# Patient Record
Sex: Female | Born: 1950 | Race: White | Hispanic: No | Marital: Married | Smoking: Never smoker
Health system: Southern US, Community
[De-identification: ages and names within clinical notes are randomized; demographics above are authoritative.]

## PROBLEM LIST (undated history)

## (undated) DIAGNOSIS — I10 Essential (primary) hypertension: Secondary | ICD-10-CM

---

## 2018-10-15 ENCOUNTER — Other Ambulatory Visit: Payer: Self-pay

## 2018-10-15 ENCOUNTER — Observation Stay: Payer: Medicare HMO | Admitting: Anesthesiology

## 2018-10-15 ENCOUNTER — Observation Stay: Payer: Medicare HMO

## 2018-10-15 ENCOUNTER — Encounter
Admission: EM | Disposition: A | Discharge status: Other Acute Inpatient Hospital | Payer: Self-pay | Source: Home / Self Care | Attending: Surgery

## 2018-10-15 ENCOUNTER — Inpatient Hospital Stay
Admission: EM | Admit: 2018-10-15 | Discharge: 2018-10-26 | DRG: 417 | Disposition: A | Discharge status: Other Acute Inpatient Hospital | Payer: Medicare HMO | Attending: Surgery | Admitting: Surgery

## 2018-10-15 ENCOUNTER — Emergency Department: Payer: Medicare HMO

## 2018-10-15 ENCOUNTER — Encounter: Payer: Self-pay | Admitting: Emergency Medicine

## 2018-10-15 DIAGNOSIS — Z1159 Encounter for screening for other viral diseases: Secondary | ICD-10-CM

## 2018-10-15 DIAGNOSIS — K219 Gastro-esophageal reflux disease without esophagitis: Secondary | ICD-10-CM | POA: Diagnosis present

## 2018-10-15 DIAGNOSIS — E876 Hypokalemia: Secondary | ICD-10-CM | POA: Diagnosis not present

## 2018-10-15 DIAGNOSIS — D649 Anemia, unspecified: Secondary | ICD-10-CM | POA: Diagnosis not present

## 2018-10-15 DIAGNOSIS — J811 Chronic pulmonary edema: Secondary | ICD-10-CM | POA: Diagnosis not present

## 2018-10-15 DIAGNOSIS — Z9889 Other specified postprocedural states: Secondary | ICD-10-CM

## 2018-10-15 DIAGNOSIS — K8020 Calculus of gallbladder without cholecystitis without obstruction: Secondary | ICD-10-CM

## 2018-10-15 DIAGNOSIS — Z79899 Other long term (current) drug therapy: Secondary | ICD-10-CM

## 2018-10-15 DIAGNOSIS — K810 Acute cholecystitis: Secondary | ICD-10-CM | POA: Diagnosis not present

## 2018-10-15 DIAGNOSIS — Z0189 Encounter for other specified special examinations: Secondary | ICD-10-CM

## 2018-10-15 DIAGNOSIS — K9171 Accidental puncture and laceration of a digestive system organ or structure during a digestive system procedure: Secondary | ICD-10-CM | POA: Diagnosis not present

## 2018-10-15 DIAGNOSIS — N179 Acute kidney failure, unspecified: Secondary | ICD-10-CM | POA: Diagnosis not present

## 2018-10-15 DIAGNOSIS — J9600 Acute respiratory failure, unspecified whether with hypoxia or hypercapnia: Secondary | ICD-10-CM

## 2018-10-15 DIAGNOSIS — K429 Umbilical hernia without obstruction or gangrene: Secondary | ICD-10-CM | POA: Diagnosis not present

## 2018-10-15 DIAGNOSIS — R0602 Shortness of breath: Secondary | ICD-10-CM

## 2018-10-15 DIAGNOSIS — R1013 Epigastric pain: Secondary | ICD-10-CM

## 2018-10-15 DIAGNOSIS — E8770 Fluid overload, unspecified: Secondary | ICD-10-CM | POA: Diagnosis not present

## 2018-10-15 DIAGNOSIS — N132 Hydronephrosis with renal and ureteral calculous obstruction: Secondary | ICD-10-CM | POA: Diagnosis not present

## 2018-10-15 DIAGNOSIS — N201 Calculus of ureter: Secondary | ICD-10-CM

## 2018-10-15 DIAGNOSIS — J9811 Atelectasis: Secondary | ICD-10-CM | POA: Diagnosis not present

## 2018-10-15 DIAGNOSIS — E872 Acidosis: Secondary | ICD-10-CM | POA: Diagnosis not present

## 2018-10-15 DIAGNOSIS — K567 Ileus, unspecified: Secondary | ICD-10-CM | POA: Diagnosis not present

## 2018-10-15 DIAGNOSIS — J9601 Acute respiratory failure with hypoxia: Secondary | ICD-10-CM | POA: Diagnosis not present

## 2018-10-15 DIAGNOSIS — K8000 Calculus of gallbladder with acute cholecystitis without obstruction: Secondary | ICD-10-CM | POA: Diagnosis not present

## 2018-10-15 DIAGNOSIS — Y658 Other specified misadventures during surgical and medical care: Secondary | ICD-10-CM | POA: Diagnosis not present

## 2018-10-15 DIAGNOSIS — J90 Pleural effusion, not elsewhere classified: Secondary | ICD-10-CM

## 2018-10-15 DIAGNOSIS — Z9049 Acquired absence of other specified parts of digestive tract: Secondary | ICD-10-CM

## 2018-10-15 DIAGNOSIS — K668 Other specified disorders of peritoneum: Secondary | ICD-10-CM

## 2018-10-15 DIAGNOSIS — J939 Pneumothorax, unspecified: Secondary | ICD-10-CM | POA: Diagnosis not present

## 2018-10-15 DIAGNOSIS — I10 Essential (primary) hypertension: Secondary | ICD-10-CM | POA: Diagnosis present

## 2018-10-15 DIAGNOSIS — R627 Adult failure to thrive: Secondary | ICD-10-CM | POA: Diagnosis present

## 2018-10-15 DIAGNOSIS — Z87442 Personal history of urinary calculi: Secondary | ICD-10-CM

## 2018-10-15 DIAGNOSIS — Z978 Presence of other specified devices: Secondary | ICD-10-CM

## 2018-10-15 DIAGNOSIS — Z452 Encounter for adjustment and management of vascular access device: Secondary | ICD-10-CM

## 2018-10-15 DIAGNOSIS — Z90710 Acquired absence of both cervix and uterus: Secondary | ICD-10-CM

## 2018-10-15 DIAGNOSIS — R6521 Severe sepsis with septic shock: Secondary | ICD-10-CM | POA: Diagnosis not present

## 2018-10-15 DIAGNOSIS — D72829 Elevated white blood cell count, unspecified: Secondary | ICD-10-CM

## 2018-10-15 DIAGNOSIS — N261 Atrophy of kidney (terminal): Secondary | ICD-10-CM | POA: Diagnosis present

## 2018-10-15 DIAGNOSIS — A419 Sepsis, unspecified organism: Secondary | ICD-10-CM | POA: Diagnosis not present

## 2018-10-15 DIAGNOSIS — K660 Peritoneal adhesions (postprocedural) (postinfection): Secondary | ICD-10-CM | POA: Diagnosis present

## 2018-10-15 DIAGNOSIS — R109 Unspecified abdominal pain: Secondary | ICD-10-CM | POA: Diagnosis not present

## 2018-10-15 HISTORY — PX: CYSTOSCOPY WITH STENT PLACEMENT: SHX5790

## 2018-10-15 HISTORY — DX: Essential (primary) hypertension: I10

## 2018-10-15 HISTORY — PX: CHOLECYSTECTOMY: SHX55

## 2018-10-15 LAB — COMPREHENSIVE METABOLIC PANEL
ALT: 12 U/L (ref 0–44)
AST: 18 U/L (ref 15–41)
Albumin: 4 g/dL (ref 3.5–5.0)
Alkaline Phosphatase: 78 U/L (ref 38–126)
Anion gap: 11 (ref 5–15)
BUN: 31 mg/dL — ABNORMAL HIGH (ref 8–23)
CO2: 22 mmol/L (ref 22–32)
Calcium: 9.7 mg/dL (ref 8.9–10.3)
Chloride: 107 mmol/L (ref 98–111)
Creatinine, Ser: 1.59 mg/dL — ABNORMAL HIGH (ref 0.44–1.00)
GFR calc Af Amer: 39 mL/min — ABNORMAL LOW (ref >60)
GFR calc non Af Amer: 33 mL/min — ABNORMAL LOW (ref >60)
Glucose, Bld: 166 mg/dL — ABNORMAL HIGH (ref 70–99)
Potassium: 3.6 mmol/L (ref 3.5–5.1)
Sodium: 140 mmol/L (ref 135–145)
Total Bilirubin: 0.5 mg/dL (ref 0.3–1.2)
Total Protein: 7.5 g/dL (ref 6.5–8.1)

## 2018-10-15 LAB — URINALYSIS, COMPLETE (UACMP) WITH MICROSCOPIC
Bilirubin Urine: NEGATIVE
Glucose, UA: NEGATIVE mg/dL
Hgb urine dipstick: NEGATIVE
Ketones, ur: NEGATIVE mg/dL
Leukocytes,Ua: NEGATIVE
Nitrite: NEGATIVE
Protein, ur: NEGATIVE mg/dL
Specific Gravity, Urine: 1.014 (ref 1.005–1.030)
pH: 5 (ref 5.0–8.0)

## 2018-10-15 LAB — CBC
HCT: 39 % (ref 36.0–46.0)
Hemoglobin: 12.6 g/dL (ref 12.0–15.0)
MCH: 29 pg (ref 26.0–34.0)
MCHC: 32.3 g/dL (ref 30.0–36.0)
MCV: 89.7 fL (ref 80.0–100.0)
Platelets: 419 10*3/uL — ABNORMAL HIGH (ref 150–400)
RBC: 4.35 MIL/uL (ref 3.87–5.11)
RDW: 12.9 % (ref 11.5–15.5)
WBC: 18 10*3/uL — ABNORMAL HIGH (ref 4.0–10.5)
nRBC: 0 % (ref 0.0–0.2)

## 2018-10-15 LAB — SURGICAL PCR SCREEN
MRSA, PCR: NEGATIVE
Staphylococcus aureus: NEGATIVE

## 2018-10-15 LAB — TROPONIN I (HIGH SENSITIVITY): Troponin I (High Sensitivity): <2 ng/L (ref <18)

## 2018-10-15 LAB — LIPASE, BLOOD: Lipase: 39 U/L (ref 11–51)

## 2018-10-15 LAB — SARS CORONAVIRUS 2 BY RT PCR (HOSPITAL ORDER, PERFORMED IN ~~LOC~~ HOSPITAL LAB): SARS Coronavirus 2: NEGATIVE

## 2018-10-15 SURGERY — LAPAROSCOPIC CHOLECYSTECTOMY
Anesthesia: General | Laterality: Right

## 2018-10-15 MED ORDER — HYDROMORPHONE HCL 1 MG/ML IJ SOLN
1.0000 mg | Freq: Once | INTRAMUSCULAR | Status: AC
  Administered 2018-10-15: 05:00:00 1 mg via INTRAVENOUS

## 2018-10-15 MED ORDER — HYDROCORTISONE NA SUCCINATE PF 100 MG IJ SOLR
INTRAMUSCULAR | Status: AC
  Filled 2018-10-15: qty 2

## 2018-10-15 MED ORDER — ONDANSETRON HCL 4 MG/2ML IJ SOLN
4.0000 mg | Freq: Once | INTRAMUSCULAR | Status: AC
  Administered 2018-10-15: 4 mg via INTRAVENOUS
  Filled 2018-10-15: qty 2

## 2018-10-15 MED ORDER — ACETAMINOPHEN 10 MG/ML IV SOLN
INTRAVENOUS | Status: AC
  Filled 2018-10-15: qty 100

## 2018-10-15 MED ORDER — SODIUM CHLORIDE 0.9 % IV SOLN
INTRAVENOUS | Status: DC
  Administered 2018-10-15 – 2018-10-18 (×13): via INTRAVENOUS

## 2018-10-15 MED ORDER — TAMSULOSIN HCL 0.4 MG PO CAPS: 0.4000 mg | ORAL_CAPSULE | Freq: Every day | ORAL | 3 refills | Status: DC

## 2018-10-15 MED ORDER — ONDANSETRON HCL 4 MG/2ML IJ SOLN
4.0000 mg | Freq: Four times a day (QID) | INTRAMUSCULAR | Status: DC | PRN
  Administered 2018-10-15 – 2018-10-25 (×8): 4 mg via INTRAVENOUS
  Filled 2018-10-15 (×7): qty 2

## 2018-10-15 MED ORDER — ACETAMINOPHEN 10 MG/ML IV SOLN
INTRAVENOUS | Status: DC | PRN
  Administered 2018-10-15: 1000 mg via INTRAVENOUS

## 2018-10-15 MED ORDER — SUCCINYLCHOLINE CHLORIDE 20 MG/ML IJ SOLN
INTRAMUSCULAR | Status: DC | PRN
  Administered 2018-10-15: 100 mg via INTRAVENOUS

## 2018-10-15 MED ORDER — MIDAZOLAM HCL 2 MG/2ML IJ SOLN
INTRAMUSCULAR | Status: AC
  Filled 2018-10-15: qty 2

## 2018-10-15 MED ORDER — LABETALOL HCL 5 MG/ML IV SOLN
INTRAVENOUS | Status: AC
  Filled 2018-10-15: qty 4

## 2018-10-15 MED ORDER — DEXAMETHASONE SODIUM PHOSPHATE 10 MG/ML IJ SOLN
INTRAMUSCULAR | Status: DC | PRN
  Administered 2018-10-15: 4 mg via INTRAVENOUS

## 2018-10-15 MED ORDER — LACTATED RINGERS IV SOLN
INTRAVENOUS | Status: DC | PRN
  Administered 2018-10-15: 13:00:00 via INTRAVENOUS

## 2018-10-15 MED ORDER — BUPIVACAINE-EPINEPHRINE (PF) 0.25% -1:200000 IJ SOLN
INTRAMUSCULAR | Status: DC | PRN
  Administered 2018-10-15: 30 mL

## 2018-10-15 MED ORDER — ENOXAPARIN SODIUM 40 MG/0.4ML ~~LOC~~ SOLN
40.0000 mg | SUBCUTANEOUS | Status: DC
  Administered 2018-10-16 – 2018-10-18 (×3): 40 mg via SUBCUTANEOUS
  Filled 2018-10-15 (×3): qty 0.4

## 2018-10-15 MED ORDER — POLYETHYLENE GLYCOL 3350 17 G PO PACK
17.0000 g | PACK | Freq: Every day | ORAL | Status: DC | PRN
  Administered 2018-10-16 – 2018-10-18 (×2): 17 g via ORAL
  Filled 2018-10-15 (×2): qty 1

## 2018-10-15 MED ORDER — BUPIVACAINE-EPINEPHRINE (PF) 0.25% -1:200000 IJ SOLN
INTRAMUSCULAR | Status: AC
  Filled 2018-10-15: qty 30

## 2018-10-15 MED ORDER — MORPHINE SULFATE (PF) 4 MG/ML IV SOLN
4.0000 mg | Freq: Once | INTRAVENOUS | Status: AC
  Administered 2018-10-15: 4 mg via INTRAVENOUS
  Filled 2018-10-15: qty 1

## 2018-10-15 MED ORDER — VASOPRESSIN 20 UNIT/ML IV SOLN
INTRAVENOUS | Status: AC
  Filled 2018-10-15: qty 1

## 2018-10-15 MED ORDER — ONDANSETRON 4 MG PO TBDP
4.0000 mg | ORAL_TABLET | Freq: Four times a day (QID) | ORAL | Status: DC | PRN
  Filled 2018-10-15 (×2): qty 1

## 2018-10-15 MED ORDER — FENTANYL CITRATE (PF) 100 MCG/2ML IJ SOLN
INTRAMUSCULAR | Status: DC | PRN
  Administered 2018-10-15 (×2): 25 ug via INTRAVENOUS
  Administered 2018-10-15: 50 ug via INTRAVENOUS

## 2018-10-15 MED ORDER — EPHEDRINE SULFATE 50 MG/ML IJ SOLN
INTRAMUSCULAR | Status: DC | PRN
  Administered 2018-10-15: 10 mg via INTRAVENOUS

## 2018-10-15 MED ORDER — ONDANSETRON HCL 4 MG/2ML IJ SOLN
INTRAMUSCULAR | Status: AC
  Filled 2018-10-15: qty 2

## 2018-10-15 MED ORDER — MIDAZOLAM HCL 2 MG/2ML IJ SOLN
INTRAMUSCULAR | Status: DC | PRN
  Administered 2018-10-15: 2 mg via INTRAVENOUS

## 2018-10-15 MED ORDER — FENTANYL CITRATE (PF) 100 MCG/2ML IJ SOLN
INTRAMUSCULAR | Status: AC
  Filled 2018-10-15: qty 2

## 2018-10-15 MED ORDER — LIDOCAINE HCL (PF) 2 % IJ SOLN
INTRAMUSCULAR | Status: AC
  Filled 2018-10-15: qty 10

## 2018-10-15 MED ORDER — ROCURONIUM BROMIDE 100 MG/10ML IV SOLN
INTRAVENOUS | Status: AC
  Filled 2018-10-15: qty 1

## 2018-10-15 MED ORDER — SUGAMMADEX SODIUM 200 MG/2ML IV SOLN
INTRAVENOUS | Status: AC
  Filled 2018-10-15: qty 2

## 2018-10-15 MED ORDER — VASOPRESSIN 20 UNIT/ML IV SOLN
INTRAVENOUS | Status: DC | PRN
  Administered 2018-10-15: .5 [IU] via INTRAVENOUS
  Administered 2018-10-15: 2 [IU] via INTRAVENOUS
  Administered 2018-10-15: .5 [IU] via INTRAVENOUS
  Administered 2018-10-15: 1 [IU] via INTRAVENOUS
  Administered 2018-10-15: 3 [IU] via INTRAVENOUS
  Administered 2018-10-15: 1 [IU] via INTRAVENOUS

## 2018-10-15 MED ORDER — PHENYLEPHRINE HCL (PRESSORS) 10 MG/ML IV SOLN
INTRAVENOUS | Status: DC | PRN
  Administered 2018-10-15: 100 ug via INTRAVENOUS
  Administered 2018-10-15: 300 ug via INTRAVENOUS
  Administered 2018-10-15 (×4): 100 ug via INTRAVENOUS

## 2018-10-15 MED ORDER — DEXAMETHASONE SODIUM PHOSPHATE 10 MG/ML IJ SOLN
INTRAMUSCULAR | Status: AC
  Filled 2018-10-15: qty 1

## 2018-10-15 MED ORDER — FENTANYL CITRATE (PF) 100 MCG/2ML IJ SOLN: 25.0000 ug | INTRAMUSCULAR | Status: DC | PRN

## 2018-10-15 MED ORDER — LACTATED RINGERS IV SOLN
INTRAVENOUS | Status: DC | PRN
  Administered 2018-10-15 (×2): via INTRAVENOUS

## 2018-10-15 MED ORDER — SODIUM CHLORIDE 0.9 % IV BOLUS
1000.0000 mL | Freq: Once | INTRAVENOUS | Status: AC
  Administered 2018-10-15: 1000 mL via INTRAVENOUS

## 2018-10-15 MED ORDER — SUGAMMADEX SODIUM 200 MG/2ML IV SOLN
INTRAVENOUS | Status: DC | PRN
  Administered 2018-10-15: 200 mg via INTRAVENOUS

## 2018-10-15 MED ORDER — LIDOCAINE HCL (CARDIAC) PF 100 MG/5ML IV SOSY
PREFILLED_SYRINGE | INTRAVENOUS | Status: DC | PRN
  Administered 2018-10-15: 100 mg via INTRAVENOUS

## 2018-10-15 MED ORDER — ONDANSETRON HCL 4 MG/2ML IJ SOLN: 4.0000 mg | Freq: Once | INTRAMUSCULAR | Status: DC | PRN

## 2018-10-15 MED ORDER — ROCURONIUM BROMIDE 100 MG/10ML IV SOLN
INTRAVENOUS | Status: DC | PRN
  Administered 2018-10-15: 10 mg via INTRAVENOUS
  Administered 2018-10-15: 40 mg via INTRAVENOUS

## 2018-10-15 MED ORDER — HYDROCORTISONE NA SUCCINATE PF 100 MG IJ SOLR
INTRAMUSCULAR | Status: DC | PRN
  Administered 2018-10-15: 100 mg via INTRAVENOUS

## 2018-10-15 MED ORDER — ACETAMINOPHEN 500 MG PO TABS
1000.0000 mg | ORAL_TABLET | Freq: Four times a day (QID) | ORAL | Status: DC | PRN
  Administered 2018-10-16 – 2018-10-18 (×2): 1000 mg via ORAL
  Filled 2018-10-15 (×2): qty 2

## 2018-10-15 MED ORDER — MUPIROCIN 2 % EX OINT
1.0000 "application " | TOPICAL_OINTMENT | Freq: Two times a day (BID) | CUTANEOUS | Status: DC
  Administered 2018-10-15: 1 via NASAL
  Filled 2018-10-15: qty 22

## 2018-10-15 MED ORDER — OXYCODONE HCL 5 MG PO TABS
5.0000 mg | ORAL_TABLET | ORAL | Status: DC | PRN
  Administered 2018-10-15 – 2018-10-18 (×9): 5 mg via ORAL
  Filled 2018-10-15 (×10): qty 1

## 2018-10-15 MED ORDER — HYDROMORPHONE HCL 1 MG/ML IJ SOLN
INTRAMUSCULAR | Status: AC
  Administered 2018-10-15: 1 mg via INTRAVENOUS
  Filled 2018-10-15: qty 1

## 2018-10-15 MED ORDER — KETOROLAC TROMETHAMINE 15 MG/ML IJ SOLN
15.0000 mg | Freq: Four times a day (QID) | INTRAMUSCULAR | Status: DC | PRN
  Administered 2018-10-15 – 2018-10-18 (×4): 15 mg via INTRAVENOUS
  Filled 2018-10-15 (×4): qty 1

## 2018-10-15 MED ORDER — PROPOFOL 10 MG/ML IV BOLUS
INTRAVENOUS | Status: DC | PRN
  Administered 2018-10-15: 150 mg via INTRAVENOUS

## 2018-10-15 MED ORDER — SUCCINYLCHOLINE CHLORIDE 20 MG/ML IJ SOLN
INTRAMUSCULAR | Status: AC
  Filled 2018-10-15: qty 1

## 2018-10-15 MED ORDER — PANTOPRAZOLE SODIUM 40 MG IV SOLR
40.0000 mg | Freq: Every day | INTRAVENOUS | Status: DC
  Administered 2018-10-15 – 2018-10-25 (×10): 40 mg via INTRAVENOUS
  Filled 2018-10-15 (×10): qty 40

## 2018-10-15 MED ORDER — PIPERACILLIN-TAZOBACTAM 3.375 G IVPB
3.3750 g | Freq: Three times a day (TID) | INTRAVENOUS | Status: DC
  Administered 2018-10-15 – 2018-10-26 (×33): 3.375 g via INTRAVENOUS
  Filled 2018-10-15 (×36): qty 50

## 2018-10-15 MED ORDER — PHENAZOPYRIDINE HCL 200 MG PO TABS: 200.0000 mg | ORAL_TABLET | Freq: Three times a day (TID) | ORAL | 0 refills | Status: DC | PRN

## 2018-10-15 MED ORDER — HYDROMORPHONE HCL 1 MG/ML IJ SOLN
0.5000 mg | INTRAMUSCULAR | Status: DC | PRN
  Administered 2018-10-15 – 2018-10-18 (×11): 0.5 mg via INTRAVENOUS
  Filled 2018-10-15 (×12): qty 0.5

## 2018-10-15 SURGICAL SUPPLY — 61 items
APPLIER CLIP 5 13 M/L LIGAMAX5 (MISCELLANEOUS) ×3
BAG DRAIN CYSTO-URO LG1000N (MISCELLANEOUS) ×3 IMPLANT
BLADE SURG 15 STRL LF DISP TIS (BLADE) ×2 IMPLANT
BLADE SURG 15 STRL SS (BLADE) ×1
CANISTER SUCT 1200ML W/VALVE (MISCELLANEOUS) ×3 IMPLANT
CATH CHOLANGI 4FR 420404F (CATHETERS) IMPLANT
CATH URETL 5X70 OPEN END (CATHETERS) ×3 IMPLANT
CATH URETL OPEN END 6FR 70 (CATHETERS) ×3 IMPLANT
CHLORAPREP W/TINT 26 (MISCELLANEOUS) ×3 IMPLANT
CLIP APPLIE 5 13 M/L LIGAMAX5 (MISCELLANEOUS) ×2 IMPLANT
CONRAY 60ML FOR OR (MISCELLANEOUS) IMPLANT
COVER WAND RF STERILE (DRAPES) ×3 IMPLANT
DERMABOND ADVANCED (GAUZE/BANDAGES/DRESSINGS) ×1
DERMABOND ADVANCED .7 DNX12 (GAUZE/BANDAGES/DRESSINGS) ×2 IMPLANT
DRAPE C-ARM XRAY 36X54 (DRAPES) IMPLANT
ELECT REM PT RETURN 9FT ADLT (ELECTROSURGICAL) ×3
ELECTRODE REM PT RTRN 9FT ADLT (ELECTROSURGICAL) ×2 IMPLANT
GLOVE BIO SURGEON STRL SZ7.5 (GLOVE) ×21 IMPLANT
GLOVE SURG SYN 7.0 (GLOVE) ×3 IMPLANT
GLOVE SURG SYN 7.5  E (GLOVE) ×1
GLOVE SURG SYN 7.5 E (GLOVE) ×2 IMPLANT
GOWN STRL REUS W/ TWL LRG LVL3 (GOWN DISPOSABLE) ×8 IMPLANT
GOWN STRL REUS W/TWL LRG LVL3 (GOWN DISPOSABLE) ×4
GUIDEWIRE STR DUAL SENSOR (WIRE) ×3 IMPLANT
IRRIGATION STRYKERFLOW (MISCELLANEOUS) ×2 IMPLANT
IRRIGATOR STRYKERFLOW (MISCELLANEOUS) ×3
IV CATH ANGIO 12GX3 LT BLUE (NEEDLE) IMPLANT
IV NS 1000ML (IV SOLUTION) ×1
IV NS 1000ML BAXH (IV SOLUTION) ×2 IMPLANT
JACKSON PRATT 10 (INSTRUMENTS) IMPLANT
L-HOOK LAP DISP 36CM (ELECTROSURGICAL) ×3
LABEL OR SOLS (LABEL) ×3 IMPLANT
LHOOK LAP DISP 36CM (ELECTROSURGICAL) ×2 IMPLANT
NEEDLE HYPO 22GX1.5 SAFETY (NEEDLE) ×3 IMPLANT
NS IRRIG 500ML POUR BTL (IV SOLUTION) ×3 IMPLANT
PACK CYSTO AR (MISCELLANEOUS) ×3 IMPLANT
PACK LAP CHOLECYSTECTOMY (MISCELLANEOUS) ×3 IMPLANT
PENCIL ELECTRO HAND CTR (MISCELLANEOUS) ×3 IMPLANT
POUCH SPECIMEN RETRIEVAL 10MM (ENDOMECHANICALS) ×3 IMPLANT
SCISSORS METZENBAUM CVD 33 (INSTRUMENTS) ×3 IMPLANT
SET CYSTO W/LG BORE CLAMP LF (SET/KITS/TRAYS/PACK) ×3 IMPLANT
SET TUBE SMOKE EVAC HIGH FLOW (TUBING) ×3 IMPLANT
SLEEVE ADV FIXATION 5X100MM (TROCAR) ×9 IMPLANT
SOL .9 NS 3000ML IRR  AL (IV SOLUTION) ×1
SOL .9 NS 3000ML IRR UROMATIC (IV SOLUTION) ×2 IMPLANT
SOL PREP PVP 2OZ (MISCELLANEOUS) ×3
SOLUTION PREP PVP 2OZ (MISCELLANEOUS) ×2 IMPLANT
SPONGE VERSALON 4X4 4PLY (MISCELLANEOUS) IMPLANT
STENT URET 6FRX24 CONTOUR (STENTS) ×3 IMPLANT
STENT URET 6FRX26 CONTOUR (STENTS) IMPLANT
SURGILUBE 2OZ TUBE FLIPTOP (MISCELLANEOUS) ×3 IMPLANT
SUT MNCRL 4-0 (SUTURE) ×1
SUT MNCRL 4-0 27XMFL (SUTURE) ×2
SUT VIC AB 2-0 SH 27 (SUTURE) ×1
SUT VIC AB 2-0 SH 27XBRD (SUTURE) ×2 IMPLANT
SUT VICRYL 0 AB UR-6 (SUTURE) ×6 IMPLANT
SUTURE MNCRL 4-0 27XMF (SUTURE) ×2 IMPLANT
TROCAR BALLN GELPORT 12X130M (ENDOMECHANICALS) ×3 IMPLANT
TROCAR Z-THREAD OPTICAL 5X100M (TROCAR) ×3 IMPLANT
Tidi urology drain bag ×3 IMPLANT
WATER STERILE IRR 1000ML POUR (IV SOLUTION) ×3 IMPLANT

## 2018-10-15 NOTE — ED Provider Notes (Signed)
[facility] Emergency Department Provider Note   ____________________________________________   I have reviewed the triage vital signs and the nursing notes.   HISTORY  Chief Complaint Abdominal pain  History limited by: Not Limited   HPI Holly Dickson is a 68 y.o. female who presents to the emergency department today because of concern for abdominal pain. The pain is located in the epigastric region. Patient states that the pain started roughly 5 hours prior to the evaluation. The patient states the pain gradually got worse. It is now severe. It has not radiated. She has had some nausea. She denies similar pain in the past. Denies any unusual ingestion. Denies any fevers.   Records reviewed. Per medical record review patient has a history of HTN.  Past Medical History:  Diagnosis Date  . Hypertension     There are no active problems to display for this patient.   History reviewed. No pertinent surgical history.  Prior to Admission medications   Not on File    Allergies Patient has no known allergies.  No family history on file.  Social History Social History   Tobacco Use  . Smoking status: Never Smoker  . Smokeless tobacco: Never Used  Substance Use Topics  . Alcohol use: Not Currently  . Drug use: Never    Review of Systems Constitutional: No fever/chills Eyes: No visual changes. ENT: No sore throat. Cardiovascular: Denies chest pain. Respiratory: Denies shortness of breath. Gastrointestinal: Positive for epigastric pain.  Genitourinary: Negative for dysuria. Musculoskeletal: Negative for back pain. Skin: Negative for rash. Neurological: Negative for headaches, focal weakness or numbness.  ____________________________________________   PHYSICAL EXAM:  VITAL SIGNS: ED Triage Vitals  Enc Vitals Group     BP 10/15/18 0323 (!) 154/98     Pulse Rate 10/15/18 0323 71     Resp 10/15/18 0323 18     Temp 10/15/18 0323 (!)  97.5 F (36.4 C)     Temp Source 10/15/18 0323 Oral     SpO2 10/15/18 0323 99 %     Weight 10/15/18 0324 152 lb (68.9 kg)     Height 10/15/18 0324 5\' 4"  (1.626 m)     Head Circumference --      Peak Flow --      Pain Score 10/15/18 0324 9   Constitutional: Alert and oriented.  Eyes: Conjunctivae are normal.  ENT      Head: Normocephalic and atraumatic.      Nose: No congestion/rhinnorhea.      Mouth/Throat: Mucous membranes are moist.      Neck: No stridor. Hematological/Lymphatic/Immunilogical: No cervical lymphadenopathy. Cardiovascular: Normal rate, regular rhythm.  No murmurs, rubs, or gallops.  Respiratory: Normal respiratory effort without tachypnea nor retractions. Breath sounds are clear and equal bilaterally. No wheezes/rales/rhonchi. Gastrointestinal: Soft and minimally tender in the epigastric region Genitourinary: Deferred Musculoskeletal: Normal range of motion in all extremities. No lower extremity edema. Neurologic:  Normal speech and language. No gross focal neurologic deficits are appreciated.  Skin:  Skin is warm, dry and intact. No rash noted. Psychiatric: Mood and affect are normal. Speech and behavior are normal. Patient exhibits appropriate insight and judgment.  ____________________________________________    LABS (pertinent positives/negatives)  CBC wbc 18.0, hgb 12.6, plt 419 Lipase 39 High sensitivity Trop <2 CMP cr 1.59, glu 166 ____________________________________________   EKG  I, Nance Pear, attending physician, personally viewed and interpreted this EKG  EKG Time: 0332 Rate: 73 Rhythm: sinus rhythm Axis: normal Intervals: qtc 457  QRS: narrow, low voltage precordial leads ST changes: no st elevation Impression: abnormal ekg   ____________________________________________    RADIOLOGY  US RUQ Large non mobile gallstone in neck of gallbladder. GB wall thickening  CT abd/pel Obstructing proximal right ureteral  stone  ____________________________________________   PROCEDURES  Procedures  ____________________________________________   INITIAL IMPRESSION / ASSESSMENT AND PLAN / ED COURSE  Pertinent labs & imaging results that were available during my care of the patient were reviewed by me and considered in my medical decision making (see chart for details).   Patient presented to the emergency department today because of concerns for epigastric pain.  On exam she did have some tenderness in the epigastric region.  Blood work does show an elevated white count of 18,000.  This point I do have concerns for gallbladder disease causing the patient's pain.  Did discuss with Dr. Aleen CampiPiscoya with surgery.  Additionally ultrasound showed severe right-sided hydronephrosis.  CT scan was obtained which showed an obstructing right proximal ureteral stone.  Discussed with Dr. Marlou PorchHerrick with urology. Discussed findings with patient.   ____________________________________________   FINAL CLINICAL IMPRESSION(S) / ED DIAGNOSES  Final diagnoses:  Epigastric pain  Gallstones  Ureteral stone     Note: This dictation was prepared with Dragon dictation. Any transcriptional errors that result from this process are unintentional     Phineas SemenGoodman, Felix Pratt, MD 10/15/18 [phone]

## 2018-10-15 NOTE — Progress Notes (Signed)
15 minute call to floor. 

## 2018-10-15 NOTE — Anesthesia Preprocedure Evaluation (Signed)
Anesthesia Evaluation  Patient identified by MRN, date of birth, ID band Patient awake    Reviewed: Allergy & Precautions, NPO status , Patient's Chart, lab work & pertinent test results  History of Anesthesia Complications Negative for: history of anesthetic complications  Airway Mallampati: II       Dental   Pulmonary neg sleep apnea, neg COPD,           Cardiovascular hypertension, Pt. on medications (-) Past MI and (-) CHF (-) dysrhythmias (-) Valvular Problems/Murmurs     Neuro/Psych neg Seizures    GI/Hepatic Neg liver ROS, GERD  Medicated and Controlled,  Endo/Other  neg diabetes  Renal/GU negative Renal ROS     Musculoskeletal   Abdominal   Peds  Hematology   Anesthesia Other Findings   Reproductive/Obstetrics                             Anesthesia Physical Anesthesia Plan  ASA: II and emergent  Anesthesia Plan: General   Post-op Pain Management:    Induction: Intravenous  PONV Risk Score and Plan: 3 and Dexamethasone, Ondansetron and Midazolam  Airway Management Planned: Oral ETT  Additional Equipment:   Intra-op Plan:   Post-operative Plan:   Informed Consent: I have reviewed the patients History and Physical, chart, labs and discussed the procedure including the risks, benefits and alternatives for the proposed anesthesia with the patient or authorized representative who has indicated his/her understanding and acceptance.       Plan Discussed with:   Anesthesia Plan Comments:         Anesthesia Quick Evaluation

## 2018-10-15 NOTE — Discharge Instructions (Signed)
DISCHARGE INSTRUCTIONS FOR KIDNEY STONE/URETERAL STENT   MEDICATIONS:  1.  Resume all your other meds from home - except do not take any extra narcotic pain meds that you may have at home.  2.  Pyridium is to help with the burning/stinging when you urinate. 3. Tamsulosin is to help with stent discomfort and spasm.  ACTIVITY:  1. No strenuous activity x 1week  2. No driving while on narcotic pain medications  3. Drink plenty of water  4. Continue to walk at home - you can still get blood clots when you are at home, so keep active, but don't over do it.  5. May return to work/school tomorrow or when you feel ready   BATHING:  1. You can shower and we recommend daily showers   SIGNS/SYMPTOMS TO CALL:  Please call us if you have a fever greater than 101.5, uncontrolled nausea/vomiting, uncontrolled pain, dizziness, unable to urinate, bloody urine, chest pain, shortness of breath, leg swelling, leg pain, redness around wound, drainage from wound, or any other concerns or questions.   You can reach [place] at [phone].   FOLLOW-UP:  1.  We will contact you in the next several days with an appointment to see 1 of the urologist from [facility] for further discussion and management of your kidney stones.

## 2018-10-15 NOTE — Interval H&P Note (Signed)
History and Physical Interval Note:  10/15/2018 11:53 AM  Holly Dickson  has presented today for surgery, with the diagnosis of obstructing right ureteral stone,acute cholecystitis.  The various methods of treatment have been discussed with the patient and family. After consideration of risks, benefits and other options for treatment, the patient has consented to  Procedure(s): LAPAROSCOPIC CHOLECYSTECTOMY (N/A) [place] (N/A) as a surgical intervention.  The patient's history has been reviewed, patient examined, no change in status, stable for surgery.  I have reviewed the patient's chart and labs.  Questions were answered to the patient's satisfaction.     Ardis Hughs

## 2018-10-15 NOTE — ED Notes (Signed)
Patient transported to Ultrasound 

## 2018-10-15 NOTE — ED Notes (Signed)
ED Provider at bedside. 

## 2018-10-15 NOTE — Anesthesia Postprocedure Evaluation (Signed)
Anesthesia Post Note  Patient: Holly Dickson  Procedure(s) Performed: LAPAROSCOPIC CHOLECYSTECTOMY with umbilical hernia repair (N/A ) CYSTOSCOPY WITH STENT PLACEMENT,right retrograde pyelogram (Right )  Patient location during evaluation: PACU Anesthesia Type: General Level of consciousness: sedated Pain management: pain level controlled Vital Signs Assessment: post-procedure vital signs reviewed and stable Respiratory status: spontaneous breathing and respiratory function stable Cardiovascular status: stable Anesthetic complications: no     Last Vitals:  Vitals:   10/15/18 0648 10/15/18 1448  BP: (!) 151/74 (!) 145/65  Pulse: 66 (!) 102  Resp: 18 17  Temp: 36.9 C 36.4 C  SpO2: 97% 95%    Last Pain:  Vitals:   10/15/18 1448  TempSrc:   PainSc: 0-No pain                 Leyli Kevorkian K

## 2018-10-15 NOTE — ED Notes (Signed)
Patient given a warm blanket at this time.  

## 2018-10-15 NOTE — Transfer of Care (Signed)
Immediate Anesthesia Transfer of Care Note  Patient: Holly Dickson  Procedure(s) Performed: LAPAROSCOPIC CHOLECYSTECTOMY with umbilical hernia repair (N/A ) CYSTOSCOPY WITH STENT PLACEMENT,right retrograde pyelogram (Right )  Patient Location: PACU  Anesthesia Type:General  Level of Consciousness: drowsy and patient cooperative  Airway & Oxygen Therapy: Patient Spontanous Breathing and Patient connected to face mask oxygen  Post-op Assessment: Report given to RN and Post -op Vital signs reviewed and stable  Post vital signs: Reviewed and stable  Last Vitals:  Vitals Value Taken Time  BP 145/65 10/15/18 1448  Temp 36.4 C 10/15/18 1448  Pulse 99 10/15/18 1450  Resp 15 10/15/18 1450  SpO2 97 % 10/15/18 1450  Vitals shown include unvalidated device data.  Last Pain:  Vitals:   10/15/18 1448  TempSrc:   PainSc: 0-No pain         Complications: No apparent anesthesia complications

## 2018-10-15 NOTE — Anesthesia Post-op Follow-up Note (Signed)
Anesthesia QCDR form completed.        

## 2018-10-15 NOTE — Consult Note (Signed)
Patient with cholecystitis and incidental right mid ureteral stone.  Her right kidney demonstrates severe hydro with atrophy of the kidney suggesting that chronic obstruction.  She also has a large stone in the left kidney.  She is not complaining of any flank pain or obstructive kidney stone pain.    Would place stent in right  ureter/kidney at the time of her cholecystectomy if that is her plan from general surgery.

## 2018-10-15 NOTE — H&P (Signed)
I have been asked to see the patient by Dr. Nance Pear, for evaluation and management of right obstructing mid-ureteral stone.  History of present illness: 68 year old female who was doing well up until 5:00 yesterday evening when she started developing epigastric tenderness, nausea, and severe pain.  She was taken to the emergency department and noted to have a dilated right kidney as well as a large gallstone on a right upper quadrant ultrasound.  A CT scan was then performed which demonstrated a obstructing right mid ureteral stone with significant renal atrophy of the right kidney as well as severe hydroureteronephrosis.  The patient was not complaining of any renal colic or associated signs or symptoms of obstructing ureteral stones.  She does have a history of kidney stones, which has been lifelong starting at the age of 16.  Her last kidney stone was approximately 10 years ago.  She also has a history of low back pain and has had several back surgeries.  She does not recall specifically having familiar kidney stone symptoms, but has had years of back pain.  She denies any hematuria or dysuria.  She is not any fevers or chills.  Review of systems: A 12 point comprehensive review of systems was obtained and is negative unless otherwise stated in the history of present illness.  Patient Active Problem List   Diagnosis Date Noted  . Acute cholecystitis 10/15/2018    No current facility-administered medications on file prior to encounter.    Current Outpatient Medications on File Prior to Encounter  Medication Sig Dispense Refill  . escitalopram (LEXAPRO) 20 MG tablet Take 20 mg by mouth daily.    Marland Kitchen lisinopril-hydrochlorothiazide (ZESTORETIC) 20-25 MG tablet Take 1 tablet by mouth daily.      Past Medical History:  Diagnosis Date  . Hypertension     History reviewed. No pertinent surgical history.  Social History   Tobacco Use  . Smoking status: Never Smoker  . Smokeless  tobacco: Never Used  Substance Use Topics  . Alcohol use: Not Currently  . Drug use: Never    No family history on file.  PE: Vitals:   10/15/18 0530 10/15/18 0557 10/15/18 0608 10/15/18 0648  BP: 128/71   (!) 151/74  Pulse: 92   66  Resp: 17   18  Temp:   98.2 F (36.8 C) 98.4 F (36.9 C)  TempSrc:   Oral Oral  SpO2: (!) 87% 98%  97%  Weight:      Height:       Patient appears to be in no acute distress  patient is alert and oriented x3 Atraumatic normocephalic head No cervical or supraclavicular lymphadenopathy appreciated No increased work of breathing, no audible wheezes/rhonchi Regular sinus rhythm/rate Abdomen is tender with guarding around the upper abdomen.  She has no right CVA tenderness Lower extremities are symmetric without appreciable edema Grossly neurologically intact No identifiable skin lesions  Recent Labs    10/15/18 0326  WBC 18.0*  HGB 12.6  HCT 39.0   Recent Labs    10/15/18 0326  NA 140  K 3.6  CL 107  CO2 22  GLUCOSE 166*  BUN 31*  CREATININE 1.59*  CALCIUM 9.7   No results for input(s): LABPT, INR in the last 72 hours. No results for input(s): LABURIN in the last 72 hours. Results for orders placed or performed during the hospital encounter of 10/15/18  SARS Coronavirus 2 (CEPHEID- Performed in [facility] hospital lab), [facility]  Status: None   Collection Time: 10/15/18  6:09 AM   Specimen: Nasopharyngeal Swab  Result Value Ref Range Status   SARS Coronavirus 2 NEGATIVE NEGATIVE Final    Comment: (NOTE) If result is NEGATIVE SARS-CoV-2 target nucleic acids are NOT DETECTED. The SARS-CoV-2 RNA is generally detectable in upper and lower  respiratory specimens during the acute phase of infection. The lowest  concentration of SARS-CoV-2 viral copies this assay can detect is 250  copies / mL. A negative result does not preclude SARS-CoV-2 infection  and should not be used as the sole basis for treatment or other  patient  management decisions.  A negative result may occur with  improper specimen collection / handling, submission of specimen other  than nasopharyngeal swab, presence of viral mutation(s) within the  areas targeted by this assay, and inadequate number of viral copies  (<250 copies / mL). A negative result must be combined with clinical  observations, patient history, and epidemiological information. If result is POSITIVE SARS-CoV-2 target nucleic acids are DETECTED. The SARS-CoV-2 RNA is generally detectable in upper and lower  respiratory specimens dur ing the acute phase of infection.  Positive  results are indicative of active infection with SARS-CoV-2.  Clinical  correlation with patient history and other diagnostic information is  necessary to determine patient infection status.  Positive results do  not rule out bacterial infection or co-infection with other viruses. If result is PRESUMPTIVE POSTIVE SARS-CoV-2 nucleic acids MAY BE PRESENT.   A presumptive positive result was obtained on the submitted specimen  and confirmed on repeat testing.  While 2019 novel coronavirus  (SARS-CoV-2) nucleic acids may be present in the submitted sample  additional confirmatory testing may be necessary for epidemiological  and / or clinical management purposes  to differentiate between  SARS-CoV-2 and other Sarbecovirus currently known to infect humans.  If clinically indicated additional testing with an alternate test  methodology [phone]) is advised. The SARS-CoV-2 RNA is generally  detectable in upper and lower respiratory sp ecimens during the acute  phase of infection. The expected result is Negative. Fact Sheet for Patients:  BoilerBrush.com.cyhttps://www.fda.gov/media/136312/download Fact Sheet for Healthcare Providers: https://pope.com/https://www.fda.gov/media/136313/download This test is not yet approved or cleared by the [place] FDA and has been authorized for detection and/or diagnosis of SARS-CoV-2 by FDA under  an Emergency Use Authorization (EUA).  This EUA will remain in effect (meaning this test can be used) for the duration of the COVID-19 declaration under Section 564(b)(1) of the Act, 21 U.S.C. section 360bbb-3(b)(1), unless the authorization is terminated or revoked sooner. Performed at [facility], [address] [place], [place] [address]     Imaging: I reviewed the patient's CT scan demonstrating a large mid/proximal right ureteral stone with severe hydro-ureteronephrosis and renal atrophy.  She also has a nonobstructing stone in the left kidney.  Her left kidney is also relatively small.  Imp: The patient has cholecystitis with an incidental finding of a right obstructing ureteral stone with severe hydronephrosis and renal atrophy.  She is otherwise asymptomatic from her kidney stone.  The stone is quite large.  She also has a stone in the left kidney which is nonobstructing.  Recommendations: The patient is scheduled to undergo a cholecystectomy, and as such we will concomitantly place a stent in her right kidney.  Following this she will [facility] to discuss ongoing treatment strategies.  We discussed potential strategies including obtaining a Lasix renogram to ascertain the relative function of  that kidney and whether or not any treatment would be necessary at all.  We also discussed the option of shockwave lithotripsy, ureteroscopy and PCNL.  I went through her stent placement with her in detail and she is agreed to proceed.   Crist FatBenjamin W Aundra Espin

## 2018-10-15 NOTE — Anesthesia Procedure Notes (Signed)
Procedure Name: Intubation Date/Time: 10/15/2018 12:15 PM Performed by: Sherrine Maples, CRNA Pre-anesthesia Checklist: Patient identified, Patient being monitored, Timeout performed, Emergency Drugs available and Suction available Patient Re-evaluated:Patient Re-evaluated prior to induction Oxygen Delivery Method: Circle system utilized Preoxygenation: Pre-oxygenation with 100% oxygen Induction Type: IV induction Laryngoscope Size: Miller and 2 Grade View: Grade I Tube type: Oral Tube size: 7.0 mm Number of attempts: 1 Airway Equipment and Method: Stylet Placement Confirmation: ETT inserted through vocal cords under direct vision,  positive ETCO2 and breath sounds checked- equal and bilateral Secured at: 20 cm Tube secured with: Tape Dental Injury: Teeth and Oropharynx as per pre-operative assessment

## 2018-10-15 NOTE — H&P (Signed)
Date of Admission:  10/15/2018  Reason for Admission:  Abdominal pain  History of Present Illness: Holly Dickson is a 68 y.o. female presenting with new onset of epigastric abdominal pain that started about 5 hours prior to coming to ED.  She reports doing well yesterday before the pain started.  Associated with nausea and vomiting.  Denies any fevers but reports feeling clammy and with chills.  The pain does not radiate.  Denies any chest pain, shortness of breath, constipation, diarrhea, dysuria or blood in the urine.  She does have a history of kidney stones in the past but is been at least 10 years since she had any issues.  Past Medical History: Past Medical History:  Diagnosis Date  . Hypertension      Past Surgical History: --Hysterectomy --Appendectomy --C-section  Home Medications: Prior to Admission medications   Medication Sig Start Date End Date Taking? Authorizing Provider  escitalopram (LEXAPRO) 20 MG tablet Take 20 mg by mouth daily. 04/27/18  Yes [provider]  lisinopril-hydrochlorothiazide (ZESTORETIC) 20-25 MG tablet Take 1 tablet by mouth daily. 04/27/18  Yes [provider]    Allergies: No Known Allergies  Social History:  reports that she has never smoked. She has never used smokeless tobacco. She reports previous alcohol use. She reports that she does not use drugs.   Family History: No family history on file.  Review of Systems: Review of Systems  Constitutional: Positive for chills. Negative for fever.  HENT: Negative for hearing loss.   Respiratory: Negative for shortness of breath.   Cardiovascular: Negative for chest pain.  Gastrointestinal: Positive for abdominal pain, nausea and vomiting. Negative for constipation and diarrhea.  Genitourinary: Negative for dysuria.  Musculoskeletal: Negative for myalgias.  Neurological: Negative for dizziness.  Psychiatric/Behavioral: Negative for depression.    Physical Exam BP (!)  151/74 (BP Location: Right Arm)   Pulse 66   Temp 98.4 F (36.9 C) (Oral)   Resp 18   Ht 5\' 4"  (1.626 m)   Wt 68.9 kg   SpO2 97%   BMI 26.09 kg/m  CONSTITUTIONAL: No acute distress HEENT:  Normocephalic, atraumatic, extraocular motion intact. NECK: Trachea is midline, and there is no jugular venous distension.  RESPIRATORY:  Lungs are clear, and breath sounds are equal bilaterally. Normal respiratory effort without pathologic use of accessory muscles. CARDIOVASCULAR: Heart is regular without murmurs, gallops, or rubs. GI: The abdomen is soft, nondistended, with tenderness to palpation in the epigastric area.  At this point there is a negative Murphy's sign, but she just had IV pain medication half an hour ago.  She does have a looks like a small supraumbilical hernia. MUSCULOSKELETAL:  Normal muscle strength and tone in all four extremities.  No peripheral edema or cyanosis. SKIN: Skin turgor is normal. There are no pathologic skin lesions.  NEUROLOGIC:  Motor and sensation is grossly normal.  Cranial nerves are grossly intact. PSYCH:  Alert and oriented to person, place and time. Affect is normal.  Laboratory Analysis: Results for orders placed or performed during the hospital encounter of 10/15/18 (from the past 24 hour(s))  Lipase, blood     Status: None   Collection Time: 10/15/18  3:26 AM  Result Value Ref Range   Lipase 39 11 - 51 U/L  Comprehensive metabolic panel     Status: Abnormal   Collection Time: 10/15/18  3:26 AM  Result Value Ref Range   Sodium 140 135 - 145 mmol/L   Potassium 3.6 3.5 -  5.1 mmol/L   Chloride 107 98 - 111 mmol/L   CO2 22 22 - 32 mmol/L   Glucose, Bld 166 (H) 70 - 99 mg/dL   BUN 31 (H) 8 - 23 mg/dL   Creatinine, Ser 5.401.59 (H) 0.44 - 1.00 mg/dL   Calcium 9.7 8.9 - 98.110.3 mg/dL   Total Protein 7.5 6.5 - 8.1 g/dL   Albumin 4.0 3.5 - 5.0 g/dL   AST 18 15 - 41 U/L   ALT 12 0 - 44 U/L   Alkaline Phosphatase 78 38 - 126 U/L   Total Bilirubin 0.5 0.3 -  1.2 mg/dL   GFR calc non Af Amer 33 (L) >60 mL/min   GFR calc Af Amer 39 (L) >60 mL/min   Anion gap 11 5 - 15  CBC     Status: Abnormal   Collection Time: 10/15/18  3:26 AM  Result Value Ref Range   WBC 18.0 (H) 4.0 - 10.5 K/uL   RBC 4.35 3.87 - 5.11 MIL/uL   Hemoglobin 12.6 12.0 - 15.0 g/dL   HCT 19.139.0 47.836.0 - 29.546.0 %   MCV 89.7 80.0 - 100.0 fL   MCH 29.0 26.0 - 34.0 pg   MCHC 32.3 30.0 - 36.0 g/dL   RDW 62.112.9 30.811.5 - 65.715.5 %   Platelets 419 (H) 150 - 400 K/uL   nRBC 0.0 0.0 - 0.2 %  Troponin I (High Sensitivity)     Status: None   Collection Time: 10/15/18  3:26 AM  Result Value Ref Range   Troponin I (High Sensitivity) <2 <18 ng/L  Urinalysis, Complete w Microscopic     Status: Abnormal   Collection Time: 10/15/18  6:09 AM  Result Value Ref Range   Color, Urine YELLOW (A) YELLOW   APPearance HAZY (A) CLEAR   Specific Gravity, Urine 1.014 1.005 - 1.030   pH 5.0 5.0 - 8.0   Glucose, UA NEGATIVE NEGATIVE mg/dL   Hgb urine dipstick NEGATIVE NEGATIVE   Bilirubin Urine NEGATIVE NEGATIVE   Ketones, ur NEGATIVE NEGATIVE mg/dL   Protein, ur NEGATIVE NEGATIVE mg/dL   Nitrite NEGATIVE NEGATIVE   Leukocytes,Ua NEGATIVE NEGATIVE   RBC / HPF 0-5 0 - 5 RBC/hpf   WBC, UA 0-5 0 - 5 WBC/hpf   Bacteria, UA RARE (A) NONE SEEN   Squamous Epithelial / LPF 0-5 0 - 5   Mucus PRESENT   SARS Coronavirus 2 ([facility]HEID- [facility] Hosp Order     Status: None   Collection Time: 10/15/18  6:09 AM   Specimen: Nasopharyngeal Swab  Result Value Ref Range   SARS Coronavirus 2 NEGATIVE NEGATIVE    Imaging: Ct Abdomen Pelvis Wo Contrast  Result Date: 10/15/2018 CLINICAL DATA:  Epigastric pain with nausea and vomiting for 4 hours. EXAM: CT ABDOMEN AND PELVIS WITHOUT CONTRAST TECHNIQUE: Multidetector CT imaging of the abdomen and pelvis was performed following the standard protocol without IV contrast. COMPARISON:  None. FINDINGS: Lower chest: No acute findings. Presumed collapsed  breast implants bilaterally. Hepatobiliary: No focal liver abnormality is seen. Large lamellated gallstone within the gallbladder, measuring 3.5 cm. No evidence of significant bile duct dilatation. Pancreas: Unremarkable. No pancreatic ductal dilatation or surrounding inflammatory changes. Spleen: Normal in size without focal abnormality. Adrenals/Urinary Tract: Adrenal glands appear normal. Severe RIGHT-sided hydronephrosis. Obstructing stone within the proximal RIGHT ureter measures 1.2 x 0.9 cm. 1.4 cm LEFT renal stone, without associated hydronephrosis. No LEFT-sided ureteral stone. Bladder is unremarkable. Stomach/Bowel: No dilated large  or small bowel loops. Fairly extensive diverticulosis of the sigmoid colon, and scattered additional diverticulosis of the transverse colon and RIGHT colon, but no focal inflammatory change to suggest acute diverticulitis. Appendix is not seen but there are no inflammatory changes about the cecum to suggest acute appendicitis. Stomach is unremarkable, partially decompressed. 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 No enlarged lymph nodes seen within the abdomen or pelvis. Reproductive: Presumed hysterectomy. No adnexal mass or free fluid seen. Other: No free fluid or abscess collection. No free intraperitoneal air. Musculoskeletal: Degenerative spondylosis of the thoracolumbar spine, moderate in degree within the lumbar spine with associated disc space narrowings, endplate sclerosis and osseous spurring. No acute appearing osseous abnormality. IMPRESSION: 1. Severe right-sided hydronephrosis, secondary to obstructing stone within the proximal right ureter measuring 1.2 x 0.9 cm. 2. 1.4 cm nonobstructing left renal stone. 3. Colonic diverticulosis without evidence of acute diverticulitis. 4. Cholelithiasis without convincing evidence of acute cholecystitis. 5. 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 6. Presumed collapsed/ruptured breast implants bilaterally. Electronically Signed   By: Franki Cabot M.D.   On: 10/15/2018 06:09   US Abdomen Limited Ruq  Result Date: 10/15/2018 CLINICAL DATA:  Epigastric pain EXAM: ULTRASOUND ABDOMEN LIMITED RIGHT UPPER QUADRANT COMPARISON:  None. FINDINGS: Gallbladder: There is gallbladder wall thickening with a 2.7 cm nonmobile stone at the gallbladder neck. There is also sludge within the gallbladder. No pericholecystic fluid. A negative sonographic Percell Miller sign was reported by the sonographer. Common bile duct: Diameter: 6 mm Liver: No focal lesion identified. Within normal limits in parenchymal echogenicity. Portal vein is patent on color Doppler imaging with normal direction of blood flow towards the liver. Other: There is severe hydronephrosis of the right kidney. IMPRESSION: 1. 2.7 cm nonmobile stone at the gallbladder neck with mild gallbladder wall thickening. Negative sonographic Murphy sign. 2. Severe hydronephrosis of the right kidney. Electronically Signed   By: Ulyses Jarred M.D.   On: 10/15/2018 05:05    Assessment and Plan: This is a 68 y.o. female presented with abdominal pain and findings of acute cholecystitis as well as a severe right-sided hydronephrosis from an obstructing right kidney stone.  Discussed with Dr. Louis Meckel from urology regarding her kidney stone.  We will take the patient to the operating room today for laparoscopic cholecystectomy as well as cystoscopy with stent placement.  Discussed with the patient risks of bleeding, infection, injury to surrounding structures, risk of potentially doing an open procedure, and she is willing to proceed.  Will be n.p.o., with IV fluid hydration, with IV antibiotics and appropriate pain medication.    Melvyn Neth, MD [facility] Pg:  [phone]

## 2018-10-15 NOTE — ED Triage Notes (Signed)
Patient brought in by ems from home with complaint of epigastric pain with nausea and vomiting times 4 hours. Patient was give IV Zofran by ems.

## 2018-10-15 NOTE — Op Note (Signed)
  Procedure Date:  10/15/2018  Pre-operative Diagnosis:  Acute cholecystitis  Post-operative Diagnosis:  Acute cholecystitis, umbilical hernia  Procedure:  Laparoscopic cholecystectomy and umbilical hernia repair  Surgeon:  Melvyn Neth, MD  Anesthesia:  General endotracheal  Estimated Blood Loss:  20 ml  Specimens:  gallbladder  Complications:  None  Indications for Procedure:  This is a 68 y.o. female who presents with abdominal pain and workup revealing acute cholecystitis.  The benefits, complications, treatment options, and expected outcomes were discussed with the patient. The risks of bleeding, infection, recurrence of symptoms, failure to resolve symptoms, bile duct damage, bile duct leak, retained common bile duct stone, bowel injury, and need for further procedures were all discussed with the patient and she was willing to proceed.  Description of Procedure: The patient was correctly identified in the preoperative area and brought into the operating room.  The patient was placed supine with VTE prophylaxis in place.  Appropriate time-outs were performed.  Anesthesia was induced and the patient was intubated.  Appropriate antibiotics were infused.  The abdomen was prepped and draped in a sterile fashion. An infraumbilical incision was made. The patient was noted to have a small umbilical hernia.  The umbilical stalk was dissected free.  The defect was extended inferiorly and a Hasson trocar was inserted.  Pneumoperitoneum was obtained with appropriate opening pressures.  A 5-mm port was placed in the subxiphoid area and two 5-mm ports were placed in the right upper quadrant under direct visualization.  The gallbladder was identified.  It was very distended and needed to be needle-decompressed.  There was significant inflammation towards the neck of the gallbladder.  The fundus was grasped and retracted cephalad.  Adhesions were lysed bluntly and with electrocautery. The  infundibulum was grasped and retracted laterally, exposing the peritoneum overlying the gallbladder.  This was incised with electrocautery and extended on either side of the gallbladder.  The cystic duct and cystic artery were clearly identified and bluntly dissected.  Both were clipped twice proximally and once distally, cutting in between.  The gallbladder was taken from the gallbladder fossa in a retrograde fashion with electrocautery. The gallbladder was placed in an Endocatch bag. The liver bed was inspected and any bleeding was controlled with electrocautery. The right upper quadrant was then inspected again revealing intact clips, no bleeding, and no ductal injury.  The area was thoroughly irrigated.  The 5 mm ports were removed under direct visualization and the Hasson trocar was removed.  The Endocatch bag was brought out via the umbilical incision. The hernia defect was closed using 0 vicryl suture.  The umbilical stalk was reattached using 2-0 Vicryl.  Local anesthetic was infused in all incisions.  The umbilical wound was closed with 2-0 Vicryl and 4-0 Monocryl and the remaining incisions were closed with 4-0 Monocryl.  The wounds were cleaned and sealed with DermaBond.  The patient was emerged from anesthesia and extubated and brought to the recovery room for further management.  The patient tolerated the procedure well and all counts were correct at the end of the case.   Melvyn Neth, MD

## 2018-10-15 NOTE — Op Note (Signed)
Preoperative diagnosis:  1. Right obstructing ureteral stone  Postoperative diagnosis:  1. Same  Procedure:  1. Cystoscopy 2. right ureteral stent placement 3. right retrograde pyelography with interpretation   Surgeon: Ardis Hughs, MD  Anesthesia: General  Complications: None  Intraoperative findings:  right retrograde pyelography demonstrated a filling defect within the right ureter consistent with the patient's known calculus and severe proximal hydronephrosis.  The stent was difficult to pass beyond the stone, suggesting the stone was impacted.  EBL: Minimal  Specimens: None  Indication: Holly Dickson is a 68 y.o. patient with epigastric pain from cholecystitis and incidental finding of a mid right ureteral stone with severe hydroureteronephrosis and renal atrophy. After reviewing the management options for treatment, he elected to proceed with the above surgical procedure(s). We have discussed the potential benefits and risks of the procedure, side effects of the proposed treatment, the likelihood of the patient achieving the goals of the procedure, and any potential problems that might occur during the procedure or recuperation. Informed consent has been obtained.  Description of procedure:  The patient was taken to the operating room and general anesthesia was induced.  The patient was placed in the dorsal lithotomy position, prepped and draped in the usual sterile fashion, and preoperative antibiotics were administered. A preoperative time-out was performed.   Cystourethroscopy was performed.  The patient's urethra was examined and was normal. The bladder was then systematically examined in its entirety. There was no evidence for any bladder tumors, stones, or other mucosal pathology.    Attention then turned to the rightureteral orifice and a ureteral catheter was used to intubate the ureteral orifice.  Omnipaque contrast was injected through the ureteral catheter  and a retrograde pyelogram was performed with findings as dictated above.  A 0.38 sensor guidewire was then advanced up the right ureter into the renal pelvis under fluoroscopic guidance.  The wire was then backloaded through the cystoscope and a ureteral stent was advance over the wire using Seldinger technique.  The stent was positioned appropriately under fluoroscopic and cystoscopic guidance.  The wire was then removed with an adequate stent curl noted in the renal pelvis as well as in the bladder.  The bladder was then emptied and the procedure ended.  The patient appeared to tolerate the procedure well and without complications.  The patient was able to be awakened and transferred to the recovery unit in satisfactory condition.    Ardis Hughs, M.D.

## 2018-10-16 LAB — BASIC METABOLIC PANEL
Anion gap: 10 (ref 5–15)
BUN: 23 mg/dL (ref 8–23)
CO2: 19 mmol/L — ABNORMAL LOW (ref 22–32)
Calcium: 8.8 mg/dL — ABNORMAL LOW (ref 8.9–10.3)
Chloride: 110 mmol/L (ref 98–111)
Creatinine, Ser: 1.54 mg/dL — ABNORMAL HIGH (ref 0.44–1.00)
GFR calc Af Amer: 40 mL/min — ABNORMAL LOW (ref >60)
GFR calc non Af Amer: 35 mL/min — ABNORMAL LOW (ref >60)
Glucose, Bld: 139 mg/dL — ABNORMAL HIGH (ref 70–99)
Potassium: 4.3 mmol/L (ref 3.5–5.1)
Sodium: 139 mmol/L (ref 135–145)

## 2018-10-16 LAB — CBC
HCT: 36.4 % (ref 36.0–46.0)
Hemoglobin: 11.7 g/dL — ABNORMAL LOW (ref 12.0–15.0)
MCH: 29 pg (ref 26.0–34.0)
MCHC: 32.1 g/dL (ref 30.0–36.0)
MCV: 90.3 fL (ref 80.0–100.0)
Platelets: 381 10*3/uL (ref 150–400)
RBC: 4.03 MIL/uL (ref 3.87–5.11)
RDW: 13.2 % (ref 11.5–15.5)
WBC: 18.9 10*3/uL — ABNORMAL HIGH (ref 4.0–10.5)
nRBC: 0 % (ref 0.0–0.2)

## 2018-10-16 LAB — HIV ANTIBODY (ROUTINE TESTING W REFLEX): HIV Screen 4th Generation wRfx: NONREACTIVE

## 2018-10-16 LAB — MAGNESIUM: Magnesium: 1.7 mg/dL (ref 1.7–2.4)

## 2018-10-16 MED ORDER — SODIUM CHLORIDE 0.9 % IV SOLN
INTRAVENOUS | Status: DC | PRN
  Administered 2018-10-16 – 2018-10-19 (×2): 250 mL via INTRAVENOUS

## 2018-10-16 MED ORDER — SIMETHICONE 80 MG PO CHEW
80.0000 mg | CHEWABLE_TABLET | Freq: Three times a day (TID) | ORAL | Status: AC
  Administered 2018-10-16 – 2018-10-18 (×6): 80 mg via ORAL
  Filled 2018-10-16 (×7): qty 1

## 2018-10-16 NOTE — Progress Notes (Signed)
10/16/2018  Subjective: Patient is 1 Day Post-Op s/p lap chole and cystoscopy with stent placement.  No acute events, but patient having lower abominal pain.  Denies any nausea.  Vital signs: Temp:  [98.9 F (37.2 C)-99.3 F (37.4 C)] 99.3 F (37.4 C) (06/28 1207) Pulse Rate:  [77-99] 99 (06/28 1207) Resp:  [18-20] 20 (06/28 0451) BP: (103-125)/(61-69) 103/61 (06/28 1207) SpO2:  [92 %-94 %] 93 % (06/28 1207)   Intake/Output: 06/27 0701 - 06/28 0700 In: 4203.9 [P.O.:120; I.V.:3892.1; IV Piggyback:191.8] Out: 9678 [Urine:1800; Blood:25] Last BM Date: 10/15/18  Physical Exam: Constitutional: No acute distress Abdomen:  Soft, non-distended, appropriately tender to palpation over incisions, and also with soreness in low abdomen on the right side.  Labs:  Recent Labs    10/15/18 0326 10/16/18 0633  WBC 18.0* 18.9*  HGB 12.6 11.7*  HCT 39.0 36.4  PLT 419* 381   Recent Labs    10/15/18 0326 10/16/18 0633  NA 140 139  K 3.6 4.3  CL 107 110  CO2 22 19*  GLUCOSE 166* 139*  BUN 31* 23  CREATININE 1.59* 1.54*  CALCIUM 9.7 8.8*   No results for input(s): LABPROT, INR in the last 72 hours.  Imaging: No results found.  Assessment/Plan: This is a 68 y.o. female s/p lap chole and cystoscopy and right ureteral stent placement.  --advance diet as tolerated --continue IV antibiotics. --OOB, ambulate --anticipate staying here today and possibly d/c home tomorrow.   Melvyn Neth, [facility]

## 2018-10-16 NOTE — Plan of Care (Signed)

## 2018-10-16 NOTE — Care Management Obs Status (Signed)
[place] NOTIFICATION   Patient Details  Name: Holly Dickson MRN: [id number] Date of Birth: January 27, 1951   Medicare Observation Status Notification Given:  Yes    Sharrie Self A Channing Yeager, RN 10/16/2018, 2:20 PM

## 2018-10-16 NOTE — Progress Notes (Signed)
Patient noted with blood tinged urine. Denied pain or burning with voiding. MD Piscoya made aware. Will continue to monitor and endorse.

## 2018-10-16 NOTE — Progress Notes (Signed)
Patient c/o abdominal pain 7/10 and requested something for gas. MD notified n/o orders received and carried out. Abdominal binder placed, TED hose placed. Patient reports significant relief. Will continue to monitor and endorse.

## 2018-10-17 ENCOUNTER — Encounter: Payer: Self-pay | Admitting: Surgery

## 2018-10-17 ENCOUNTER — Telehealth: Payer: Self-pay | Admitting: Urology

## 2018-10-17 DIAGNOSIS — R109 Unspecified abdominal pain: Secondary | ICD-10-CM | POA: Diagnosis present

## 2018-10-17 DIAGNOSIS — J811 Chronic pulmonary edema: Secondary | ICD-10-CM | POA: Diagnosis not present

## 2018-10-17 DIAGNOSIS — J9601 Acute respiratory failure with hypoxia: Secondary | ICD-10-CM | POA: Diagnosis not present

## 2018-10-17 DIAGNOSIS — K567 Ileus, unspecified: Secondary | ICD-10-CM | POA: Diagnosis not present

## 2018-10-17 DIAGNOSIS — K668 Other specified disorders of peritoneum: Secondary | ICD-10-CM | POA: Diagnosis not present

## 2018-10-17 DIAGNOSIS — I10 Essential (primary) hypertension: Secondary | ICD-10-CM | POA: Diagnosis present

## 2018-10-17 DIAGNOSIS — J80 Acute respiratory distress syndrome: Secondary | ICD-10-CM | POA: Diagnosis not present

## 2018-10-17 DIAGNOSIS — K9171 Accidental puncture and laceration of a digestive system organ or structure during a digestive system procedure: Secondary | ICD-10-CM | POA: Diagnosis not present

## 2018-10-17 DIAGNOSIS — R627 Adult failure to thrive: Secondary | ICD-10-CM | POA: Diagnosis present

## 2018-10-17 DIAGNOSIS — D649 Anemia, unspecified: Secondary | ICD-10-CM | POA: Diagnosis not present

## 2018-10-17 DIAGNOSIS — K660 Peritoneal adhesions (postprocedural) (postinfection): Secondary | ICD-10-CM | POA: Diagnosis present

## 2018-10-17 DIAGNOSIS — Y658 Other specified misadventures during surgical and medical care: Secondary | ICD-10-CM | POA: Diagnosis not present

## 2018-10-17 DIAGNOSIS — J9811 Atelectasis: Secondary | ICD-10-CM | POA: Diagnosis not present

## 2018-10-17 DIAGNOSIS — R6521 Severe sepsis with septic shock: Secondary | ICD-10-CM | POA: Diagnosis not present

## 2018-10-17 DIAGNOSIS — N201 Calculus of ureter: Secondary | ICD-10-CM

## 2018-10-17 DIAGNOSIS — Z87442 Personal history of urinary calculi: Secondary | ICD-10-CM | POA: Diagnosis not present

## 2018-10-17 DIAGNOSIS — N132 Hydronephrosis with renal and ureteral calculous obstruction: Secondary | ICD-10-CM | POA: Diagnosis present

## 2018-10-17 DIAGNOSIS — E8770 Fluid overload, unspecified: Secondary | ICD-10-CM | POA: Diagnosis not present

## 2018-10-17 DIAGNOSIS — K429 Umbilical hernia without obstruction or gangrene: Secondary | ICD-10-CM | POA: Diagnosis present

## 2018-10-17 DIAGNOSIS — J90 Pleural effusion, not elsewhere classified: Secondary | ICD-10-CM | POA: Diagnosis not present

## 2018-10-17 DIAGNOSIS — N261 Atrophy of kidney (terminal): Secondary | ICD-10-CM | POA: Diagnosis present

## 2018-10-17 DIAGNOSIS — Z1159 Encounter for screening for other viral diseases: Secondary | ICD-10-CM | POA: Diagnosis not present

## 2018-10-17 DIAGNOSIS — K219 Gastro-esophageal reflux disease without esophagitis: Secondary | ICD-10-CM | POA: Diagnosis present

## 2018-10-17 DIAGNOSIS — A419 Sepsis, unspecified organism: Secondary | ICD-10-CM | POA: Diagnosis not present

## 2018-10-17 DIAGNOSIS — J939 Pneumothorax, unspecified: Secondary | ICD-10-CM | POA: Diagnosis not present

## 2018-10-17 DIAGNOSIS — E872 Acidosis: Secondary | ICD-10-CM | POA: Diagnosis not present

## 2018-10-17 DIAGNOSIS — E876 Hypokalemia: Secondary | ICD-10-CM | POA: Diagnosis not present

## 2018-10-17 DIAGNOSIS — K8000 Calculus of gallbladder with acute cholecystitis without obstruction: Secondary | ICD-10-CM | POA: Diagnosis present

## 2018-10-17 DIAGNOSIS — N179 Acute kidney failure, unspecified: Secondary | ICD-10-CM | POA: Diagnosis not present

## 2018-10-17 LAB — CBC
HCT: 32.3 % — ABNORMAL LOW (ref 36.0–46.0)
Hemoglobin: 10.5 g/dL — ABNORMAL LOW (ref 12.0–15.0)
MCH: 29 pg (ref 26.0–34.0)
MCHC: 32.5 g/dL (ref 30.0–36.0)
MCV: 89.2 fL (ref 80.0–100.0)
Platelets: 285 10*3/uL (ref 150–400)
RBC: 3.62 MIL/uL — ABNORMAL LOW (ref 3.87–5.11)
RDW: 13.7 % (ref 11.5–15.5)
WBC: 21.8 10*3/uL — ABNORMAL HIGH (ref 4.0–10.5)
nRBC: 0 % (ref 0.0–0.2)

## 2018-10-17 LAB — URINALYSIS, ROUTINE W REFLEX MICROSCOPIC
Bacteria, UA: NONE SEEN
RBC / HPF: >50 RBC/hpf — ABNORMAL HIGH (ref 0–5)
Specific Gravity, Urine: 1.021 (ref 1.005–1.030)
Squamous Epithelial / HPF: NONE SEEN (ref 0–5)
WBC, UA: >50 WBC/hpf — ABNORMAL HIGH (ref 0–5)

## 2018-10-17 LAB — BASIC METABOLIC PANEL
Anion gap: 6 (ref 5–15)
BUN: 24 mg/dL — ABNORMAL HIGH (ref 8–23)
CO2: 19 mmol/L — ABNORMAL LOW (ref 22–32)
Calcium: 8.1 mg/dL — ABNORMAL LOW (ref 8.9–10.3)
Chloride: 110 mmol/L (ref 98–111)
Creatinine, Ser: 1.65 mg/dL — ABNORMAL HIGH (ref 0.44–1.00)
GFR calc Af Amer: 37 mL/min — ABNORMAL LOW (ref >60)
GFR calc non Af Amer: 32 mL/min — ABNORMAL LOW (ref >60)
Glucose, Bld: 115 mg/dL — ABNORMAL HIGH (ref 70–99)
Potassium: 3.7 mmol/L (ref 3.5–5.1)
Sodium: 135 mmol/L (ref 135–145)

## 2018-10-17 MED ORDER — ESCITALOPRAM OXALATE 10 MG PO TABS: 20.0000 mg | ORAL_TABLET | Freq: Every day | ORAL | Status: DC

## 2018-10-17 MED ORDER — ESCITALOPRAM OXALATE 10 MG PO TABS
10.0000 mg | ORAL_TABLET | Freq: Every day | ORAL | Status: DC
  Administered 2018-10-17 – 2018-10-18 (×2): 10 mg via ORAL
  Filled 2018-10-17 (×3): qty 1

## 2018-10-17 NOTE — Progress Notes (Signed)
Urology Consult Follow Up  Subjective: POD # 2 s/p lap chole and ureteral stent placement.  Patient denies any stent discomfort but is having lower abdominal pain.  Creatinine 1.65.  WBC count 21.8.    Anti-infectives: Anti-infectives (From admission, onward)   Start     Dose/Rate Route Frequency Ordered Stop   10/15/18 0545  piperacillin-tazobactam (ZOSYN) IVPB 3.375 g     3.375 g 12.5 mL/hr over 240 Minutes Intravenous Every 8 hours 10/15/18 0535        Current Facility-Administered Medications  Medication Dose Route Frequency Provider Last Rate Last Dose  . 0.9 %  sodium chloride infusion   Intravenous Continuous Olean Ree, MD 125 mL/hr at 10/17/18 0202    . 0.9 %  sodium chloride infusion   Intravenous PRN Olean Ree, MD   Stopped at 10/16/18 0144  . acetaminophen (TYLENOL) tablet 1,000 mg  1,000 mg Oral Q6H PRN Olean Ree, MD   1,000 mg at 10/16/18 2056  . enoxaparin (LOVENOX) injection 40 mg  40 mg Subcutaneous Q24H Piscoya, Jose, MD   40 mg at 10/16/18 0911  . HYDROmorphone (DILAUDID) injection 0.5 mg  0.5 mg Intravenous Q4H PRN Piscoya, Jose, MD   0.5 mg at 10/17/18 0336  . ketorolac (TORADOL) 15 MG/ML injection 15 mg  15 mg Intravenous Q6H PRN Piscoya, Jose, MD   15 mg at 10/16/18 1404  . ondansetron (ZOFRAN-ODT) disintegrating tablet 4 mg  4 mg Oral Q6H PRN Piscoya, Jose, MD       Or  . ondansetron (ZOFRAN) injection 4 mg  4 mg Intravenous Q6H PRN Olean Ree, MD   4 mg at 10/16/18 0914  . oxyCODONE (Oxy IR/ROXICODONE) immediate release tablet 5 mg  5 mg Oral Q4H PRN Olean Ree, MD   5 mg at 10/17/18 0015  . pantoprazole (PROTONIX) injection 40 mg  40 mg Intravenous QHS Piscoya, Jose, MD   40 mg at 10/16/18 2026  . piperacillin-tazobactam (ZOSYN) IVPB 3.375 g  3.375 g Intravenous Q8H Piscoya, Jose, MD 12.5 mL/hr at 10/17/18 0200 3.375 g at 10/17/18 0200  . polyethylene glycol (MIRALAX / GLYCOLAX) packet 17 g  17 g Oral Daily PRN Olean Ree, MD   17 g at  10/16/18 0911  . simethicone (MYLICON) chewable tablet 80 mg  80 mg Oral TID PC Piscoya, Jose, MD   80 mg at 10/16/18 1750     Objective: Vital signs in last 24 hours: Temp:  [99 F (37.2 C)-100.6 F (38.1 C)] 99 F (37.2 C) (06/29 0537) Pulse Rate:  [98-109] 106 (06/29 0537) Resp:  [18-20] 20 (06/29 0537) BP: (103-127)/(61-68) 110/68 (06/29 0537) SpO2:  [90 %-93 %] 90 % (06/29 0537)  Intake/Output from previous day: 06/28 0701 - 06/29 0700 In: 3403.6 [I.V.:3201.6; IV Piggyback:201.9] Out: 500 [Urine:500] Intake/Output this shift: Total I/O In: 275 [I.V.:250; IV Piggyback:25] Out: -    Physical Exam Constitutional:  Well nourished. Alert and oriented, No acute distress. HEENT: [place] AT, moist mucus membranes.  Trachea midline, no masses. Cardiovascular: No clubbing, cyanosis, or edema. Respiratory: Normal respiratory effort, with wheeze, nurse currently addressing issue, no increased work of breathing. GI: Abdomen is soft, tender as expected, non distended, no abdominal masses.  GU: No CVA tenderness.  No bladder fullness or masses.   Neurologic: Grossly intact, no focal deficits, moving all 4 extremities. Psychiatric: Normal mood and affect.  Lab Results:  Recent Labs    10/16/18 0633 10/17/18 0700  WBC 18.9* 21.8*  HGB 11.7* 10.5*  HCT 36.4 32.3*  PLT 381 285   BMET Recent Labs    10/16/18 0633 10/17/18 0700  NA 139 135  K 4.3 3.7  CL 110 110  CO2 19* 19*  GLUCOSE 139* 115*  BUN 23 24*  CREATININE 1.54* 1.65*  CALCIUM 8.8* 8.1*   PT/INR No results for input(s): LABPROT, INR in the last 72 hours. ABG No results for input(s): PHART, HCO3 in the last 72 hours.  Invalid input(s): PCO2, PO2  Studies/Results: Dg C-arm 1-60 Min-no Report  Result Date: 10/15/2018 Fluoroscopy was utilized by the requesting physician.  No radiographic interpretation.     Assessment: s/p Procedure(s): LAPAROSCOPIC CHOLECYSTECTOMY with umbilical hernia repair CYSTOSCOPY  WITH STENT PLACEMENT,right retrograde pyelogram Mrs. 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 Will need outpatient follow up for definitive treatment for her stone - will call patient to make appointment     LOS: 0 days    [facility] 10/17/2018

## 2018-10-17 NOTE — Telephone Encounter (Signed)
-----   Message from Ardis Hughs, MD sent at 10/15/2018 10:39 AM EDT ----- Regarding: f/u This patient had a stent placed on Saturday and needs f/u in the next 1-2 weeks w/ MD to discuss treatment options for her stones. Thank you, BH

## 2018-10-17 NOTE — Telephone Encounter (Signed)
App made ° ° °Michelle  °

## 2018-10-17 NOTE — Telephone Encounter (Signed)
Holly Dickson will need a follow up after she is discharged to discuss definitive stone management.

## 2018-10-17 NOTE — Progress Notes (Signed)
[place] Hospital Day(s): 0.   Post op day(s): 2 Days Post-Op.   Interval History: Patient seen and examined, no acute events or new complaints overnight. Patient reports that she is feeling better this morning although appears very somnolent throughout interview. No reports of fevers this morning but she was febrile to 100.6 at 2000 yesterday. No reports of chills, nausea, or emesis. She endorses abdominal soreness near her umbilical and epigastric incisions. She is passing flatus. Tolerated regular diet. Did mobilize around the unit this morning.    Vital signs in last 24 hours: [min-max] current  Temp:  [99 F (37.2 C)-100.6 F (38.1 C)] 99 F (37.2 C) (06/29 0537) Pulse Rate:  [98-109] 103 (06/29 0830) Resp:  [18-20] 20 (06/29 0537) BP: (103-127)/(61-68) 110/68 (06/29 0537) SpO2:  [90 %-95 %] 95 % (06/29 0830)     Height: 5\' 4"  (162.6 cm) Weight: 68.9 kg BMI (Calculated): 26.08   Intake/Output last 2 shifts:  06/28 0701 - 06/29 0700 In: 3403.6 [I.V.:3201.6; IV Piggyback:201.9] Out: 500 [Urine:500]   Physical Exam:  Constitutional: somnolent but arouses to verbal stimuli and appropriate answers questions, cooperative and no distress  Respiratory: breathing non-labored at rest, on [place]  Gastrointestinal: soft, incisional soreness, and non-distended Integumentary: Laparoscopic incisions are CDI with demrabond, some ecchymosis on umbilical incision, no erythema or drainage   Labs:  CBC Latest Ref Rng & Units 10/17/2018 10/16/2018 10/15/2018  WBC 4.0 - 10.5 K/uL 21.8(H) 18.9(H) 18.0(H)  Hemoglobin 12.0 - 15.0 g/dL 10.5(L) 11.7(L) 12.6  Hematocrit 36.0 - 46.0 % 32.3(L) 36.4 39.0  Platelets 150 - 400 K/uL 285 381 419(H)   CMP Latest Ref Rng & Units 10/17/2018 10/16/2018 10/15/2018  Glucose 70 - 99 mg/dL 115(H) 139(H) 166(H)  BUN 8 - 23 mg/dL 24(H) 23 31(H)  Creatinine 0.44 - 1.00 mg/dL 1.65(H) 1.54(H) 1.59(H)  Sodium 135 - 145 mmol/L 135 139  140  Potassium 3.5 - 5.1 mmol/L 3.7 4.3 3.6  Chloride 98 - 111 mmol/L 110 110 107  CO2 22 - 32 mmol/L 19(L) 19(L) 22  Calcium 8.9 - 10.3 mg/dL 8.1(L) 8.8(L) 9.7  Total Protein 6.5 - 8.1 g/dL - - 7.5  Total Bilirubin 0.3 - 1.2 mg/dL - - 0.5  Alkaline Phos 38 - 126 U/L - - 78  AST 15 - 41 U/L - - 18  ALT 0 - 44 U/L - - 12     Imaging studies: No new pertinent imaging studies   Assessment/Plan:  68 y.o. female with worsening leukocytosis although otherwise clinically seems to be doing well 2 Days Post-Op s/p laparoscopic cholecystectomy for acute cholecysitits   - Continue regular diet as tolerates  - Continue IV Abx (Zosyn)  - pain control prn; antiemetics prn  - monitor abdominal examination; on-going bowel function    - Monitor leukocytosis  - Will check urinalysis   - mobilization encouraged  - medical management of comorbidities  - wean to room air   - DVT prophylaxis    All of the above findings and recommendations were discussed with the patient, and the medical team, and all of patient's questions were answered to her expressed satisfaction.  -- Edison Simon, PA-C [facility] 10/17/2018, 9:31 AM [phone] M-F: 7am - 4pm

## 2018-10-18 ENCOUNTER — Inpatient Hospital Stay: Payer: Medicare HMO

## 2018-10-18 ENCOUNTER — Encounter: Payer: Self-pay | Admitting: Anesthesiology

## 2018-10-18 ENCOUNTER — Encounter
Admission: EM | Disposition: A | Discharge status: Other Acute Inpatient Hospital | Payer: Self-pay | Source: Home / Self Care | Attending: Surgery

## 2018-10-18 ENCOUNTER — Inpatient Hospital Stay: Payer: Medicare HMO | Admitting: Anesthesiology

## 2018-10-18 HISTORY — PX: LAPAROTOMY: SHX154

## 2018-10-18 HISTORY — PX: BOWEL RESECTION: SHX1257

## 2018-10-18 LAB — URINALYSIS, ROUTINE W REFLEX MICROSCOPIC
Bacteria, UA: NONE SEEN
Bilirubin Urine: NEGATIVE
Glucose, UA: 50 mg/dL — AB
Ketones, ur: 5 mg/dL — AB
Nitrite: NEGATIVE
Protein, ur: 100 mg/dL — AB
RBC / HPF: >50 RBC/hpf — ABNORMAL HIGH (ref 0–5)
Specific Gravity, Urine: 1.032 — ABNORMAL HIGH (ref 1.005–1.030)
Squamous Epithelial / HPF: NONE SEEN (ref 0–5)
WBC, UA: >50 WBC/hpf — ABNORMAL HIGH (ref 0–5)
pH: 5 (ref 5.0–8.0)

## 2018-10-18 LAB — CBC
HCT: 35.1 % — ABNORMAL LOW (ref 36.0–46.0)
Hemoglobin: 11.2 g/dL — ABNORMAL LOW (ref 12.0–15.0)
MCH: 28.6 pg (ref 26.0–34.0)
MCHC: 31.9 g/dL (ref 30.0–36.0)
MCV: 89.8 fL (ref 80.0–100.0)
Platelets: 321 10*3/uL (ref 150–400)
RBC: 3.91 MIL/uL (ref 3.87–5.11)
RDW: 13.8 % (ref 11.5–15.5)
WBC: 16.3 10*3/uL — ABNORMAL HIGH (ref 4.0–10.5)
nRBC: 0 % (ref 0.0–0.2)

## 2018-10-18 LAB — BASIC METABOLIC PANEL
Anion gap: 7 (ref 5–15)
BUN: 22 mg/dL (ref 8–23)
CO2: 20 mmol/L — ABNORMAL LOW (ref 22–32)
Calcium: 8.4 mg/dL — ABNORMAL LOW (ref 8.9–10.3)
Chloride: 111 mmol/L (ref 98–111)
Creatinine, Ser: 1.7 mg/dL — ABNORMAL HIGH (ref 0.44–1.00)
GFR calc Af Amer: 36 mL/min — ABNORMAL LOW (ref >60)
GFR calc non Af Amer: 31 mL/min — ABNORMAL LOW (ref >60)
Glucose, Bld: 98 mg/dL (ref 70–99)
Potassium: 3.7 mmol/L (ref 3.5–5.1)
Sodium: 138 mmol/L (ref 135–145)

## 2018-10-18 LAB — SURGICAL PATHOLOGY

## 2018-10-18 SURGERY — LAPAROTOMY, EXPLORATORY
Anesthesia: General | Site: Abdomen

## 2018-10-18 MED ORDER — ACETAMINOPHEN 500 MG PO TABS: 1000.0000 mg | ORAL_TABLET | ORAL | Status: DC

## 2018-10-18 MED ORDER — VASOPRESSIN 20 UNIT/ML IV SOLN
INTRAVENOUS | Status: DC | PRN
  Administered 2018-10-18: 2 [IU] via INTRAVENOUS

## 2018-10-18 MED ORDER — SODIUM CHLORIDE (PF) 0.9 % IJ SOLN
INTRAMUSCULAR | Status: DC | PRN
  Administered 2018-10-18: 10 mL via INTRAVENOUS

## 2018-10-18 MED ORDER — MIDAZOLAM HCL 2 MG/2ML IJ SOLN
INTRAMUSCULAR | Status: DC | PRN
  Administered 2018-10-18 – 2018-10-19 (×2): 1 mg via INTRAVENOUS

## 2018-10-18 MED ORDER — VASOPRESSIN 20 UNIT/ML IV SOLN
INTRAVENOUS | Status: AC
  Filled 2018-10-18: qty 1

## 2018-10-18 MED ORDER — BUPIVACAINE HCL 0.5 % IJ SOLN
INTRAMUSCULAR | Status: DC | PRN
  Administered 2018-10-18: 30 mL

## 2018-10-18 MED ORDER — BUPIVACAINE HCL (PF) 0.5 % IJ SOLN
INTRAMUSCULAR | Status: AC
  Filled 2018-10-18: qty 30

## 2018-10-18 MED ORDER — FENTANYL CITRATE (PF) 100 MCG/2ML IJ SOLN
INTRAMUSCULAR | Status: DC | PRN
  Administered 2018-10-18: 50 ug via INTRAVENOUS
  Administered 2018-10-19: 25 ug via INTRAVENOUS

## 2018-10-18 MED ORDER — CHLORHEXIDINE GLUCONATE 4 % EX LIQD: 1.0000 "application " | Freq: Once | CUTANEOUS | Status: DC

## 2018-10-18 MED ORDER — PROPOFOL 10 MG/ML IV BOLUS
INTRAVENOUS | Status: DC | PRN
  Administered 2018-10-18: 140 mg via INTRAVENOUS

## 2018-10-18 MED ORDER — IOHEXOL 240 MG/ML SOLN
25.0000 mL | INTRAMUSCULAR | Status: AC
  Administered 2018-10-18 (×2): 25 mL via INTRAVENOUS

## 2018-10-18 MED ORDER — EPHEDRINE SULFATE 50 MG/ML IJ SOLN
INTRAMUSCULAR | Status: AC
  Filled 2018-10-18: qty 1

## 2018-10-18 MED ORDER — PHENYLEPHRINE HCL (PRESSORS) 10 MG/ML IV SOLN
INTRAVENOUS | Status: AC
  Filled 2018-10-18: qty 1

## 2018-10-18 MED ORDER — SUCCINYLCHOLINE CHLORIDE 20 MG/ML IJ SOLN
INTRAMUSCULAR | Status: DC | PRN
  Administered 2018-10-18: 100 mg via INTRAVENOUS

## 2018-10-18 MED ORDER — PROPOFOL 10 MG/ML IV BOLUS
INTRAVENOUS | Status: AC
  Filled 2018-10-18: qty 20

## 2018-10-18 MED ORDER — MIDAZOLAM HCL 2 MG/2ML IJ SOLN
INTRAMUSCULAR | Status: AC
  Filled 2018-10-18: qty 2

## 2018-10-18 MED ORDER — PHENYLEPHRINE HCL (PRESSORS) 10 MG/ML IV SOLN
INTRAVENOUS | Status: DC | PRN
  Administered 2018-10-18: 200 ug via INTRAVENOUS

## 2018-10-18 MED ORDER — LACTATED RINGERS IV SOLN
INTRAVENOUS | Status: DC | PRN
  Administered 2018-10-18: 22:00:00 via INTRAVENOUS

## 2018-10-18 MED ORDER — ROCURONIUM BROMIDE 100 MG/10ML IV SOLN
INTRAVENOUS | Status: AC
  Filled 2018-10-18: qty 1

## 2018-10-18 MED ORDER — BUPIVACAINE LIPOSOME 1.3 % IJ SUSP
INTRAMUSCULAR | Status: DC | PRN
  Administered 2018-10-18: 20 mL

## 2018-10-18 MED ORDER — ROCURONIUM BROMIDE 100 MG/10ML IV SOLN
INTRAVENOUS | Status: DC | PRN
  Administered 2018-10-18: 10 mg via INTRAVENOUS
  Administered 2018-10-18: 20 mg via INTRAVENOUS
  Administered 2018-10-18: 40 mg via INTRAVENOUS

## 2018-10-18 MED ORDER — SODIUM CHLORIDE (PF) 0.9 % IJ SOLN
INTRAMUSCULAR | Status: AC
  Filled 2018-10-18: qty 10

## 2018-10-18 MED ORDER — SODIUM CHLORIDE 0.9 % IV SOLN
INTRAVENOUS | Status: DC | PRN
  Administered 2018-10-18: 50 ug via INTRAVENOUS

## 2018-10-18 MED ORDER — LIDOCAINE HCL (PF) 2 % IJ SOLN
INTRAMUSCULAR | Status: AC
  Filled 2018-10-18: qty 10

## 2018-10-18 MED ORDER — LIDOCAINE HCL (CARDIAC) PF 100 MG/5ML IV SOSY
PREFILLED_SYRINGE | INTRAVENOUS | Status: DC | PRN
  Administered 2018-10-18: 50 mg via INTRAVENOUS

## 2018-10-18 MED ORDER — SUCCINYLCHOLINE CHLORIDE 20 MG/ML IJ SOLN
INTRAMUSCULAR | Status: AC
  Filled 2018-10-18: qty 1

## 2018-10-18 MED ORDER — BUPIVACAINE LIPOSOME 1.3 % IJ SUSP
INTRAMUSCULAR | Status: AC
  Filled 2018-10-18: qty 20

## 2018-10-18 MED ORDER — FENTANYL CITRATE (PF) 250 MCG/5ML IJ SOLN
INTRAMUSCULAR | Status: AC
  Filled 2018-10-18: qty 5

## 2018-10-18 SURGICAL SUPPLY — 77 items
APPLIER CLIP 11 MED OPEN (CLIP)
APPLIER CLIP 13 LRG OPEN (CLIP)
BAG BILE T-TUBES STRL (MISCELLANEOUS) IMPLANT
BARRIER SKIN 2 3/4 (OSTOMY) IMPLANT
BARRIER SKIN OD2.25 2 3/4 FLNG (OSTOMY) IMPLANT
BLADE SURG 15 STRL LF DISP TIS (BLADE) IMPLANT
BLADE SURG 15 STRL SS (BLADE) ×1
BLADE SURG SZ10 CARB STEEL (BLADE) ×2 IMPLANT
BULB RESERV EVAC DRAIN JP 100C (MISCELLANEOUS) ×1 IMPLANT
CANISTER SUCT 1200ML W/VALVE (MISCELLANEOUS) ×1 IMPLANT
CANISTER SUCT 3000ML PPV (MISCELLANEOUS) ×2 IMPLANT
CHLORAPREP W/TINT 26 (MISCELLANEOUS) ×3 IMPLANT
CLAMP POUCH DRAINAGE QUIET (OSTOMY) IMPLANT
CLIP APPLIE 11 MED OPEN (CLIP) IMPLANT
CLIP APPLIE 13 LRG OPEN (CLIP) ×2 IMPLANT
COVER WAND RF STERILE (DRAPES) ×3 IMPLANT
DRAIN CHANNEL JP 19F (MISCELLANEOUS) ×1 IMPLANT
DRAPE LAPAROTOMY 100X77 ABD (DRAPES) ×3 IMPLANT
DRSG OPSITE POSTOP 4X10 (GAUZE/BANDAGES/DRESSINGS) ×3 IMPLANT
DRSG TEGADERM 4X4.75 (GAUZE/BANDAGES/DRESSINGS) ×1 IMPLANT
ELECT BLADE 6 FLAT ULTRCLN (ELECTRODE) ×2 IMPLANT
ELECT REM PT RETURN 9FT ADLT (ELECTROSURGICAL) ×3
ELECTRODE REM PT RTRN 9FT ADLT (ELECTROSURGICAL) ×4 IMPLANT
GAUZE SPONGE 4X4 12PLY STRL (GAUZE/BANDAGES/DRESSINGS) ×2 IMPLANT
GLOVE BIOGEL PI IND STRL 7.5 (GLOVE) ×2 IMPLANT
GLOVE BIOGEL PI INDICATOR 7.5 (GLOVE) ×3
GLOVE ECLIPSE 7.0 STRL STRAW (GLOVE) ×7 IMPLANT
GOWN STRL REUS W/TWL LRG LVL3 (GOWN DISPOSABLE) ×9 IMPLANT
HANDLE SUCTION POOLE (INSTRUMENTS) ×2 IMPLANT
KIT TURNOVER KIT A (KITS) ×3 IMPLANT
LIGASURE IMPACT 36 18CM CVD LR (INSTRUMENTS) ×1 IMPLANT
NDL HPO THNWL 1X22GA REG BVL (NEEDLE) IMPLANT
NEEDLE HYPO 22GX1.5 SAFETY (NEEDLE) ×2 IMPLANT
NEEDLE SAFETY 22GX1 (NEEDLE) ×1
NS IRRIG 1000ML POUR BTL (IV SOLUTION) ×7 IMPLANT
PACK BASIN MAJOR ARMC (MISCELLANEOUS) ×3 IMPLANT
PACK COLON CLEAN CLOSURE (MISCELLANEOUS) ×2 IMPLANT
RELOAD LINEAR CUT PROX 55 BLUE (ENDOMECHANICALS) IMPLANT
RELOAD PROXIMATE 75MM BLUE (ENDOMECHANICALS) ×9 IMPLANT
RELOAD STAPLE 55 3.8 BLU REG (ENDOMECHANICALS) IMPLANT
RELOAD STAPLE 75 3.8 BLU REG (ENDOMECHANICALS) IMPLANT
RELOAD STAPLER LINEAR PROX 30 (STAPLE) IMPLANT
RETRACTOR WND ALEXIS-O 25 LRG (MISCELLANEOUS) IMPLANT
RETRACTOR WOUND ALXS 18CM MED (MISCELLANEOUS) IMPLANT
RTRCTR WOUND ALEXIS O 18CM MED (MISCELLANEOUS)
RTRCTR WOUND ALEXIS O 25CM LRG (MISCELLANEOUS)
SPONGE DRAIN TRACH 4X4 STRL 2S (GAUZE/BANDAGES/DRESSINGS) ×1 IMPLANT
SPONGE EXCIL AMD DRAIN 4X4 6P (MISCELLANEOUS) ×1 IMPLANT
SPONGE LAP 18X18 RF (DISPOSABLE) ×3 IMPLANT
STAPLER GUN LINEAR PROX 60 (STAPLE) IMPLANT
STAPLER PROXIMATE 55 BLUE (STAPLE) IMPLANT
STAPLER PROXIMATE 75MM BLUE (STAPLE) ×1 IMPLANT
STAPLER RELOAD LINEAR PROX 30 (STAPLE)
STAPLER RELOADABLE 30 BLU REG (STAPLE) IMPLANT
STAPLER SKIN PROX 35W (STAPLE) ×1 IMPLANT
SUCTION POOLE HANDLE (INSTRUMENTS)
SUT CHROMIC 0 SH (SUTURE) IMPLANT
SUT CHROMIC 2 0 SH (SUTURE) IMPLANT
SUT CHROMIC 3 0 SH 27 (SUTURE) IMPLANT
SUT ETHILON 3-0 FS-10 30 BLK (SUTURE) ×3
SUT PDS #1 CTX NDL (SUTURE) ×2 IMPLANT
SUT PDS AB 0 CT1 27 (SUTURE) ×4 IMPLANT
SUT PROLENE 3 0 PS 2 (SUTURE) IMPLANT
SUT SILK 0 FSL (SUTURE) IMPLANT
SUT SILK 2 0 (SUTURE)
SUT SILK 2 0 REEL (SUTURE) IMPLANT
SUT SILK 2-0 18XBRD TIE 12 (SUTURE) ×2 IMPLANT
SUT SILK 3-0 (SUTURE) ×3
SUT SILK 3-0 SH-1 18XCR BRD (SUTURE) ×6
SUT VIC AB 0 CT1 18XCR BRD 8 (SUTURE) IMPLANT
SUT VIC AB 0 CT1 8-18 (SUTURE)
SUTURE EHLN 3-0 FS-10 30 BLK (SUTURE) IMPLANT
SUTURE SILK 3-0 SH-1 18XCR BRD (SUTURE) ×2 IMPLANT
SYR 20CC LL (SYRINGE) ×2 IMPLANT
SYR 30ML LL (SYRINGE) ×2 IMPLANT
TOWEL OR 17X26 4PK STRL BLUE (TOWEL DISPOSABLE) ×2 IMPLANT
TRAY FOLEY SLVR 16FR LF STAT (SET/KITS/TRAYS/PACK) ×3 IMPLANT

## 2018-10-18 NOTE — Progress Notes (Signed)
Patient extremely nauseous and in pain. Patient unable to finish second bottle of contrast. Per CT should be enough to conduct the test.   Fuller Mandril, RN

## 2018-10-18 NOTE — Consult Note (Signed)
PULMONARY / CRITICAL CARE MEDICINE   Name: Holly Dickson MRN: [id number] DOB: [birth year]    ADMISSION DATE:  10/15/2018 CONSULTATION DATE:  10/18/2018   REFERRING MD:  Dr. Hampton Abbot   CHIEF COMPLAINT:  Small Bowel Perforation  BRIEF DISCUSSION: 68 y.o. Female admitted 6/27 with abdominal pain, found to have Acute Cholecystitis &  severe right Hydronephrosis from obstructing right renal stone.  She underwent Laparoscopic Cholecystectomy & Cystoscopy with right ureteral stent placement.  On 6/30 she was noted to have fever, tachycardia, and periumbilical pain.  Follow up CT Abdomen/Pelvis concerning for post surgical small bowel perforation, along with small right sided pneumothorax and right pleural effusion.  She was taken emergently to the OR for Exploratory Laparotomy with small bowel resection. She returns to ICU post procedure, remains intubated due to concern for developing sepsis. Small right sided PTX is not appreciated on follow up CXR.  HISTORY OF PRESENT ILLNESS:   Holly Dickson is a 68 y.o. Female with a PMH history of Hypertension who presented to [facility] ED on 10/15/18 with complaints of acute onset of epigastric abdominal pain (starting about 5 hours prior to presentation).  She also reported associated nausea and vomiting, and chills.  She denied chest pain, shortness of breath, constipation, diarrhea, dysuria, or blood in the urine. She was found to have acute cholecystitis and severe right-sided hydronephrosis from an obstructing right kidney stone.  She was subsequently taken to the OR for laparoscopic cholecystectomy and cystoscopy with right ureteral stent placement.  On 6/30 she was noted to have fever, tachycardia, and periumbilical pain.  Repeat CT abdomen and pelvis was concerning for postsurgical small bowel perforation.  She was taken emergently to the OR for exploratory laparotomy. She was found to have small bowel perforation requiring small bowel resection and extensive lysis  of adhesions. Given concern for developing sepsis, right sided pneumothorax and pleural effusion, she remains intubated post procedure and is transferred to ICU.  PCCM is consulted for further management.  Follow-up CXR in ICU reveals large right pleural effusion and bilateral atelectasis.  Previously seen small pneumothorax is no longer appreciable on CXR.   PAST MEDICAL HISTORY :  She  has a past medical history of Hypertension.  PAST SURGICAL HISTORY: She  has a past surgical history that includes Cholecystectomy (N/A, 10/15/2018) and Cystoscopy with stent placement (Right, 10/15/2018).  No Known Allergies  No current facility-administered medications on file prior to encounter.    Current Outpatient Medications on File Prior to Encounter  Medication Sig  . escitalopram (LEXAPRO) 20 MG tablet Take 20 mg by mouth daily.  Marland Kitchen lisinopril-hydrochlorothiazide (ZESTORETIC) 20-25 MG tablet Take 1 tablet by mouth daily.    FAMILY HISTORY:  Her has no family status information on file.    SOCIAL HISTORY: She  reports that she has never smoked. She has never used smokeless tobacco. She reports previous alcohol use. She reports that she does not use drugs.    COVID-19 DISASTER DECLARATION:  FULL CONTACT PHYSICAL EXAMINATION WAS NOT POSSIBLE DUE TO TREATMENT OF COVID-19 AND  CONSERVATION OF PERSONAL PROTECTIVE EQUIPMENT, LIMITED EXAM FINDINGS INCLUDE-  Patient assessed or the symptoms described in the history of present illness.  In the context of the Global COVID-19 pandemic, which necessitated consideration that the patient might be at risk for infection with the SARS-CoV-2 virus that causes COVID-19, Institutional protocols and algorithms that pertain to the evaluation of patients at risk for COVID-19 are in a state of rapid change based on information released  by regulatory bodies including the CDC and federal and state organizations. These policies and algorithms were followed during the  patient's care while in hospital.   REVIEW OF SYSTEMS:   Unable to assess due to intubation and sedation  SUBJECTIVE:  Unable to assess due to intubation and sedation  VITAL SIGNS: BP 109/61 (BP Location: Left Arm)   Pulse 98   Temp 98.6 F (37 C) (Oral)   Resp 18   Ht '5\' 4"'  (1.626 m)   Wt 68.9 kg   SpO2 94%   BMI 26.09 kg/m   HEMODYNAMICS:    VENTILATOR SETTINGS:    INTAKE / OUTPUT: I/O last 3 completed shifts: In: 5018.5 [P.O.:933; I.V.:3968.3; IV Piggyback:117.2] Out: 1400 [Urine:1400]  PHYSICAL EXAMINATION: General: Acutely ill-appearing female, laying in bed, intubated, tolerating vent, no acute distress Neuro: Sedated, withdraws to pain, pupils PERRLA 3 mm bilaterally HEENT: Atraumatic, normocephalic, neck supple, no JVD, ET tube in place Cardiovascular: Regular rate and rhythm, S1-S2, no murmurs rubs or gallops, 2+ pulses throughout Lungs: Clear to auscultation bilaterally, vent assisted, even, no accessory muscle use Abdomen: Soft, mildly distended, no guarding or rebound tenderness, no bowel sounds audible, midline abdominal incision with honeycomb dressing dry and intact, JP drain right lower quadrant Musculoskeletal: No deformities, normal bulk and tone, no edema Skin: Warm and dry, no obvious rashes, lesions, or ulcerations  LABS:  BMET Recent Labs  Lab 10/16/18 0633 10/17/18 0700 10/18/18 0410  NA 139 135 138  K 4.3 3.7 3.7  CL 110 110 111  CO2 19* 19* 20*  BUN 23 24* 22  CREATININE 1.54* 1.65* 1.70*  GLUCOSE 139* 115* 98    Electrolytes Recent Labs  Lab 10/16/18 0633 10/17/18 0700 10/18/18 0410  CALCIUM 8.8* 8.1* 8.4*  MG 1.7  --   --     CBC Recent Labs  Lab 10/16/18 0633 10/17/18 0700 10/18/18 0410  WBC 18.9* 21.8* 16.3*  HGB 11.7* 10.5* 11.2*  HCT 36.4 32.3* 35.1*  PLT 381 285 321    Coag's No results for input(s): APTT, INR in the last 168 hours.  Sepsis Markers No results for input(s): LATICACIDVEN, PROCALCITON,  O2SATVEN in the last 168 hours.  ABG No results for input(s): PHART, PCO2ART, PO2ART in the last 168 hours.  Liver Enzymes Recent Labs  Lab 10/15/18 0326  AST 18  ALT 12  ALKPHOS 78  BILITOT 0.5  ALBUMIN 4.0    Cardiac Enzymes No results for input(s): TROPONINI, PROBNP in the last 168 hours.  Glucose No results for input(s): GLUCAP in the last 168 hours.  Imaging Ct Abdomen Pelvis Wo Contrast  Result Date: 10/18/2018 CLINICAL DATA:  Generalized abdominal pain, febrile and tachycardic. Three days postop laparoscopic cholecystectomy. EXAM: CT ABDOMEN AND PELVIS WITHOUT CONTRAST TECHNIQUE: Multidetector CT imaging of the abdomen and pelvis was performed following the standard protocol without IV contrast. COMPARISON:  CT scan 10/15/2018 FINDINGS: Lower chest: There is a moderate-sized right pleural effusion with overlying atelectasis. There is also a small basilar pneumothorax. Very small left pleural effusion. Small amount of pericardial fluid but no overt effusion. Contrast in the esophagus may suggest reflux disease. Hepatobiliary: Small amount of fluid and air around the liver likely from the recent cholecystectomy. I do not see any fluid collections in the cholecystectomy bed to suggest biloma or bile leak. No common bile duct dilatation. Pancreas: No mass, inflammation or ductal dilatation. Spleen: Normal size.  No focal lesions Adrenals/Urinary Tract: The adrenal glands are unremarkable and stable. There  is a right-sided double-J ureteral stent in place with a 7 mm ureteral calculus adjacent to the stent in the mid ureter. The hydronephrosis has improved. Stable large lower pole left renal calculus. Bladder is grossly normal. Stomach/Bowel: The stomach and duodenum are unremarkable. There is leaking oral contrast in the anterior abdomen on the right side. Appears to be coming from the bowel loop sub adjacent to the umbilicus. Findings worrisome for a bowel injury at the umbilical port  site. There is also a moderate-sized collection or free air in this area. The remainder of the small bowel is grossly normal. There is moderate fluid around the colon but I do not see an obvious injury. Extensive sigmoid colon diverticulosis. Vascular/Lymphatic: No aortic calcifications or aneurysm. Partially calcified splenic artery aneurysm again noted. No mesenteric or retroperitoneal mass or adenopathy. Scattered lymph nodes are noted. Reproductive: Surgically absent. Other: Small to moderate amount of free pelvic fluid. Musculoskeletal: No significant bony findings. IMPRESSION: 1. CT findings worrisome for a bowel injury involving the small bowel near the umbilicus. There is leaking oral contrast and a small to moderate amount of free intra-abdominal air and fluid. 2. Moderate-sized right pleural effusion with overlying atelectasis and a small right-sided pneumothorax. 3. Right double-J ureteral stent in place with improved right-sided hydronephrosis. 7 mm calculus adjacent to the stent in the mid upper ureter. These results were called by telephone at the time of interpretation on 10/18/2018 at 7:40 pm to the patients nurse in the hospital, who verbally acknowledged these results. Electronically Signed   By: Marijo Sanes M.D.   On: 10/18/2018 19:40     STUDIES:  CT Abdomen/Pelvis 6/27>> 1. Severe right-sided hydronephrosis, secondary to obstructing stone within the proximal right ureter measuring 1.2 x 0.9 cm. 2. 1.4 cm nonobstructing left renal stone. 3. Colonic diverticulosis without evidence of acute diverticulitis. 4. Cholelithiasis without convincing evidence of acute cholecystitis. 5. 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 6. Presumed collapsed/ruptured breast implants bilaterally.  CT Abdomen/Pelvis 6/30>> 1. CT findings worrisome for a bowel injury involving the small bowel near the umbilicus. There is leaking oral contrast and a small to  moderate amount of free intra-abdominal air and fluid. 2. Moderate-sized right pleural effusion with overlying atelectasis and a small right-sided pneumothorax. 3. Right double-J ureteral stent in place with improved right-sided hydronephrosis. 7 mm calculus adjacent to the stent in the mid upper Ureter.  CULTURES: MRSA PCR 6/27>>Negative SARS-CoV-2 PCR 6/27>>Negative Blood x2 7/1>>  ANTIBIOTICS: Zosyn 6/27>>  SIGNIFICANT EVENTS: 6/27>> Emergent Laparoscopic Cholecystectomy & Cystoscopy with Right Stent Placement; Admitted to Med/surg unit post procedure 6/30>> Fever, tachycardia, periumbilical pain 9/74>> Repeat CT concerning for small bowel perforation; taken emergently to OR for Exploratory Laparotomy   LINES/TUBES: ETT 6/30>> JP Drain 6/30>>   ASSESSMENT / PLAN:  PULMONARY A: Remains intubated post procedure Small Right Pneumothorax>>not noted on follow-up CXR Large Right Pleural Effusion P:   Full Vent support, PRVC: 8 cc/kg Wean FiO2 & PEEP as tolerated Follow intermittent CXR & ABG as needed VAP Bundle SBT when parameters met Prn Bronchodilators Consult IR for Thoracentesis  CARDIOVASCULAR A:  No acute issues Hx: HTN P:  Cardiac monitoring Maintain MAP >65 IV Fluids Levophed if needed to maintain MAP goal  RENAL A:   Right obstructing Renal stone AKI Non-Anion Gap Metabolic acidosis P:   Urology following, appreciate input S/p Cystoscopy & right Ureteral Stent placement on 6/27 Monitor I&O's / urinary output Follow BMP Ensure adequate  renal perfusion Avoid nephrotoxic agents as able Replace electrolytes as indicated IV Fluids Will give 2 amps bicarb pushes and place on Bicarb infusion; follow up ABG later this morning Trend Lactic Acid  GASTROINTESTINAL A:   Small bowel perforation Acute Cholecystitis P:   NPO NG tube to LIS IV Fluids General Surgery following, appreciate input, will follow recommendations  S/p Cholecystectomy on  6/27 S/p Emergent Exploratory Laparotomy w/ Bowel resection 6/30 Protonix for SUP  HEMATOLOGIC A:   Anemia without s/sx of bleeding P:  Monitor for S/Sx of bleeding Trend CBC Lovenox SQ for VTE Prophylaxis  Transfuse for Hgb <7  INFECTIOUS A:   Sepsis secondary to Small Bowel Perforation P:   Monitor fever curve Trend WBC's and Procalcitonin Follow cultures as above Continue Zosyn  ENDOCRINE A:   No acute issues P:   CBG's Follow ICU Hypo/hyperglycemia protocol  NEUROLOGIC A:   Sedation needs in setting of mechanical ventilation P:   RASS goal: 0 to -1 Fentanyl drip and prn versed pushes to maintain RASS goal Daily WUA Provide supportive care   FAMILY  - Updates: Dr. Hampton Abbot updated pt's husband via telephone upon arrival to ICU 7/1.  Pt is a Full Code.  - Inter-disciplinary family meet or Palliative Care meeting due by:  10/24/2018    Darel Hong, AGACNP-BC [facility] Pager: [phone] Cell: [phone]  10/18/2018, 10:31 PM

## 2018-10-18 NOTE — Anesthesia Procedure Notes (Signed)
Procedure Name: Intubation Date/Time: 10/18/2018 9:22 PM Performed by: Lendon Colonel, CRNA Pre-anesthesia Checklist: Patient identified, Patient being monitored, Timeout performed, Emergency Drugs available and Suction available Patient Re-evaluated:Patient Re-evaluated prior to induction Oxygen Delivery Method: Circle system utilized Preoxygenation: Pre-oxygenation with 100% oxygen Induction Type: IV induction, Rapid sequence and Cricoid Pressure applied Laryngoscope Size: Miller Grade View: Grade I Tube type: Oral Tube size: 7.0 mm Number of attempts: 1 Airway Equipment and Method: Stylet Placement Confirmation: ETT inserted through vocal cords under direct vision,  positive ETCO2 and breath sounds checked- equal and bilateral Secured at: 21 cm Tube secured with: Tape Dental Injury: Teeth and Oropharynx as per pre-operative assessment

## 2018-10-18 NOTE — Progress Notes (Signed)
Patient's husband Timmothy Sours called for update. Husband did not know the password. Stated to husband I was unable to speak with him in regards to patient's care. Husband became upset. Husband asked if I would ask patient to call him. Stated to husband I would relay the message.   Fuller Mandril, RN

## 2018-10-18 NOTE — Progress Notes (Signed)
[place] Hospital Day(s): 1.   Post op day(s): 3 Days Post-Op.   Interval History: Patient seen and examined, no acute events or new complaints overnight. Patient reports that she is feeling okay. She reports overnight had an episode of abdominal pain around her umbilicus. This was improved this morning. No reports of nausea or emesis. She has tolerated a regular diet although reports no flatus nor bowel movements. She does endorse frequent burping. Of note, after evaluation this afternoon she was found to be febrile to 100.7 and tachycardic to 106.   Vital signs in last 24 hours: [min-max] current  Temp:  [99 F (37.2 C)-100.7 F (38.2 C)] 100.7 F (38.2 C) (06/30 1200) Pulse Rate:  [99-106] 106 (06/30 1200) Resp:  [18-20] 18 (06/30 1200) BP: (109-141)/(61-82) 109/61 (06/30 1200) SpO2:  [91 %-94 %] 91 % (06/30 1200)     Height: 5\' 4"  (162.6 cm) Weight: 68.9 kg BMI (Calculated): 26.08   Intake/Output last 2 shifts:  06/29 0701 - 06/30 0700 In: 1482.8 [P.O.:120; I.V.:1337.8; IV Piggyback:25] Out: 2956 [Urine:1150]   Physical Exam:  Constitutional: febrile, alert, cooperative and no distress  Respiratory: breathing non-labored at rest  Cardiovascular: tachycardic Gastrointestinal: soft, expected umbilical tenderness, and non-distended Integumentary: Laparoscopic incisions are CDI with demrabond, some ecchymosis on umbilical incision, no erythema or drainage  Labs:  CBC Latest Ref Rng & Units 10/18/2018 10/17/2018 10/16/2018  WBC 4.0 - 10.5 K/uL 16.3(H) 21.8(H) 18.9(H)  Hemoglobin 12.0 - 15.0 g/dL 11.2(L) 10.5(L) 11.7(L)  Hematocrit 36.0 - 46.0 % 35.1(L) 32.3(L) 36.4  Platelets 150 - 400 K/uL 321 285 381   CMP Latest Ref Rng & Units 10/18/2018 10/17/2018 10/16/2018  Glucose 70 - 99 mg/dL 98 115(H) 139(H)  BUN 8 - 23 mg/dL 22 24(H) 23  Creatinine 0.44 - 1.00 mg/dL 1.70(H) 1.65(H) 1.54(H)  Sodium 135 - 145 mmol/L 138 135 139  Potassium 3.5 -  5.1 mmol/L 3.7 3.7 4.3  Chloride 98 - 111 mmol/L 111 110 110  CO2 22 - 32 mmol/L 20(L) 19(L) 19(L)  Calcium 8.9 - 10.3 mg/dL 8.4(L) 8.1(L) 8.8(L)  Total Protein 6.5 - 8.1 g/dL - - -  Total Bilirubin 0.3 - 1.2 mg/dL - - -  Alkaline Phos 38 - 126 U/L - - -  AST 15 - 41 U/L - - -  ALT 0 - 44 U/L - - -     Imaging studies: No new pertinent imaging studies   Assessment/Plan: 68 y.o. female with what sounds like possible post-surgical ileus although she is febrile and tachycardic this afternoon with improving leukocytosis 3 Days Post-Op s/p laparoscopic cholecystectomy and right ureteral stent placement   - Will make NPO for now  - Will obtain CT Abdomen/Pelvis with PO contrast only to reassess given fever, tachycardia  - Will check with urologist to determine if there could be any issues with stent; will also repeat UA  - Continue IV Abx (Zosyn)             - pain control prn; antiemetics prn             - monitor abdominal examination; on-going bowel function               - Monitor leukocytosis; renal function             - mobilization encouraged; IS use             - medical management of comorbidities             -  DVT prophylaxis    All of the above findings and recommendations were discussed with the patient, and the medical team, and all of patient's questions were answered to her expressed satisfaction.  -- Lynden OxfordZachary Aeon Koors, PA-C [facility] 10/18/2018, 12:10 PM [phone] M-F: 7am - 4pm

## 2018-10-18 NOTE — Progress Notes (Signed)
Spoke to patient's brother Nicki Reaper and gave him an update on patient. Will continue to monitor patient.

## 2018-10-18 NOTE — Anesthesia Preprocedure Evaluation (Addendum)
Anesthesia Evaluation  Patient identified by MRN, date of birth, ID band Patient awake    Reviewed: Allergy & Precautions, NPO status , Patient's Chart, lab work & pertinent test results  History of Anesthesia Complications Negative for: history of anesthetic complications  Airway Mallampati: II       Dental   Pulmonary neg pulmonary ROS, neg sleep apnea, neg COPD,           Cardiovascular hypertension, Pt. on medications (-) Past MI and (-) CHF (-) dysrhythmias (-) Valvular Problems/Murmurs     Neuro/Psych neg Seizures negative neurological ROS  negative psych ROS   GI/Hepatic Neg liver ROS, GERD  Medicated and Controlled,  Endo/Other  negative endocrine ROSneg diabetes  Renal/GU negative Renal ROS     Musculoskeletal negative musculoskeletal ROS (+)   Abdominal   Peds  Hematology negative hematology ROS (+)   Anesthesia Other Findings Past Medical History: No date: Hypertension  Reproductive/Obstetrics                             Anesthesia Physical  Anesthesia Plan  ASA: III and emergent  Anesthesia Plan: General   Post-op Pain Management:    Induction: Intravenous, Rapid sequence and Cricoid pressure planned  PONV Risk Score and Plan: 3 and Dexamethasone, Ondansetron and Midazolam  Airway Management Planned: Oral ETT  Additional Equipment:   Intra-op Plan:   Post-operative Plan: Extubation in OR and Possible Post-op intubation/ventilation  Informed Consent: I have reviewed the patients History and Physical, chart, labs and discussed the procedure including the risks, benefits and alternatives for the proposed anesthesia with the patient or authorized representative who has indicated his/her understanding and acceptance.     Dental advisory given  Plan Discussed with: CRNA and Surgeon  Anesthesia Plan Comments:        Anesthesia Quick Evaluation

## 2018-10-18 NOTE — Progress Notes (Signed)
Abdominal CT personally reviewed, reveals extravasation of oral contrast adjacent to umbilicus with peritoneal fluid and focal > distributed pneumoperitoneum (beyond those expected post-operatively), together concerning for post-surgical small bowel perforation. These findings were reviewed with Dr. Hampton Abbot and discussed with patient and her husband. Patient reports persistent focal peri-umbilical pain and overall "not feeling well". All risks, benefits, and alternatives to emergent laparotomy with likely small bowel resection were discussed with the patient and her husband, who both express understanding, all of their questions were answered to their expressed satisfaction, patient expresses she wishes to proceed, and informed consent was obtained. She has been NPO since noon today, though this is an emergent procedure. Discussed with OR and patient's RN. Will additionally plan to repeat chest x-ray post-operatively to reassess small Right basilar PTX with effusion.  Please call if any questions or concerns.  -- Marilynne Drivers Rosana Hoes, MD, [place]: [facility] - Partnering for exceptional care. Office: [phone]

## 2018-10-18 NOTE — Progress Notes (Signed)
Patient has been escorted off the unit by transport on the way to OR.

## 2018-10-19 ENCOUNTER — Inpatient Hospital Stay: Payer: Medicare HMO

## 2018-10-19 ENCOUNTER — Encounter: Payer: Self-pay | Admitting: Surgery

## 2018-10-19 DIAGNOSIS — K668 Other specified disorders of peritoneum: Secondary | ICD-10-CM

## 2018-10-19 LAB — CBC
HCT: 40.9 % (ref 36.0–46.0)
Hemoglobin: 12.7 g/dL (ref 12.0–15.0)
MCH: 28.7 pg (ref 26.0–34.0)
MCHC: 31.1 g/dL (ref 30.0–36.0)
MCV: 92.5 fL (ref 80.0–100.0)
Platelets: 398 10*3/uL (ref 150–400)
RBC: 4.42 MIL/uL (ref 3.87–5.11)
RDW: 14.1 % (ref 11.5–15.5)
WBC: 13.4 10*3/uL — ABNORMAL HIGH (ref 4.0–10.5)
nRBC: 0 % (ref 0.0–0.2)

## 2018-10-19 LAB — BASIC METABOLIC PANEL
Anion gap: 11 (ref 5–15)
Anion gap: 14 (ref 5–15)
BUN: 26 mg/dL — ABNORMAL HIGH (ref 8–23)
BUN: 30 mg/dL — ABNORMAL HIGH (ref 8–23)
CO2: 14 mmol/L — ABNORMAL LOW (ref 22–32)
CO2: 14 mmol/L — ABNORMAL LOW (ref 22–32)
Calcium: 7.7 mg/dL — ABNORMAL LOW (ref 8.9–10.3)
Calcium: 7.8 mg/dL — ABNORMAL LOW (ref 8.9–10.3)
Chloride: 114 mmol/L — ABNORMAL HIGH (ref 98–111)
Chloride: 115 mmol/L — ABNORMAL HIGH (ref 98–111)
Creatinine, Ser: 2.09 mg/dL — ABNORMAL HIGH (ref 0.44–1.00)
Creatinine, Ser: 2.16 mg/dL — ABNORMAL HIGH (ref 0.44–1.00)
GFR calc Af Amer: 28 mL/min — ABNORMAL LOW (ref >60)
GFR calc Af Amer: 27 mL/min — ABNORMAL LOW (ref >60)
GFR calc non Af Amer: 24 mL/min — ABNORMAL LOW (ref >60)
GFR calc non Af Amer: 23 mL/min — ABNORMAL LOW (ref >60)
Glucose, Bld: 112 mg/dL — ABNORMAL HIGH (ref 70–99)
Glucose, Bld: 158 mg/dL — ABNORMAL HIGH (ref 70–99)
Potassium: 4.1 mmol/L (ref 3.5–5.1)
Potassium: 4.8 mmol/L (ref 3.5–5.1)
Sodium: 139 mmol/L (ref 135–145)
Sodium: 143 mmol/L (ref 135–145)

## 2018-10-19 LAB — BLOOD GAS, ARTERIAL
Acid-base deficit: 13.9 mmol/L — ABNORMAL HIGH (ref 0.0–2.0)
Bicarbonate: 12.8 mmol/L — ABNORMAL LOW (ref 20.0–28.0)
FIO2: 0.4
MECHVT: 450 mL
O2 Saturation: 91.5 %
PEEP: 5 cmH2O
Patient temperature: 37
RATE: 16 resp/min
pCO2 arterial: 32 mmHg (ref 32.0–48.0)
pH, Arterial: 7.21 — ABNORMAL LOW (ref 7.350–7.450)
pO2, Arterial: 76 mmHg — ABNORMAL LOW (ref 83.0–108.0)

## 2018-10-19 LAB — PROCALCITONIN: Procalcitonin: 3.43 ng/mL

## 2018-10-19 LAB — LACTIC ACID, PLASMA
Lactic Acid, Venous: 2.2 mmol/L (ref 0.5–1.9)
Lactic Acid, Venous: 2.9 mmol/L (ref 0.5–1.9)

## 2018-10-19 MED ORDER — CHLORHEXIDINE GLUCONATE CLOTH 2 % EX PADS
6.0000 | MEDICATED_PAD | Freq: Every day | CUTANEOUS | Status: DC
  Administered 2018-10-19 – 2018-10-23 (×3): 6 via TOPICAL

## 2018-10-19 MED ORDER — FENTANYL BOLUS VIA INFUSION
50.0000 ug | Freq: Once | INTRAVENOUS | Status: DC
  Filled 2018-10-19: qty 50

## 2018-10-19 MED ORDER — IPRATROPIUM-ALBUTEROL 0.5-2.5 (3) MG/3ML IN SOLN
3.0000 mL | RESPIRATORY_TRACT | Status: DC | PRN
  Administered 2018-10-21 – 2018-10-24 (×9): 3 mL via RESPIRATORY_TRACT
  Filled 2018-10-19 (×10): qty 3

## 2018-10-19 MED ORDER — NOREPINEPHRINE BITARTRATE 1 MG/ML IV SOLN
0.0000 ug/min | INTRAVENOUS | Status: DC
  Administered 2018-10-19: 2 ug/min via INTRAVENOUS
  Filled 2018-10-19: qty 16

## 2018-10-19 MED ORDER — FENTANYL BOLUS VIA INFUSION
25.0000 ug | INTRAVENOUS | Status: DC | PRN
  Filled 2018-10-19: qty 25

## 2018-10-19 MED ORDER — ENOXAPARIN SODIUM 30 MG/0.3ML ~~LOC~~ SOLN: 30.0000 mg | SUBCUTANEOUS | Status: DC

## 2018-10-19 MED ORDER — CHLORHEXIDINE GLUCONATE 0.12% ORAL RINSE (MEDLINE KIT)
15.0000 mL | Freq: Two times a day (BID) | OROMUCOSAL | Status: DC
  Administered 2018-10-19 – 2018-10-20 (×4): 15 mL via OROMUCOSAL

## 2018-10-19 MED ORDER — SODIUM BICARBONATE 8.4 % IV SOLN
INTRAVENOUS | Status: DC
  Administered 2018-10-19 (×2): via INTRAVENOUS
  Filled 2018-10-19 (×3): qty 150

## 2018-10-19 MED ORDER — MIDAZOLAM HCL 2 MG/2ML IJ SOLN: 1.0000 mg | INTRAMUSCULAR | Status: DC | PRN

## 2018-10-19 MED ORDER — SODIUM BICARBONATE 8.4 % IV SOLN
INTRAVENOUS | Status: AC
  Administered 2018-10-19: 100 meq via INTRAVENOUS
  Filled 2018-10-19: qty 100

## 2018-10-19 MED ORDER — ESCITALOPRAM OXALATE 10 MG PO TABS
10.0000 mg | ORAL_TABLET | Freq: Every day | ORAL | Status: DC
  Administered 2018-10-20: 10 mg
  Filled 2018-10-19: qty 1

## 2018-10-19 MED ORDER — LACTATED RINGERS IV BOLUS
500.0000 mL | Freq: Once | INTRAVENOUS | Status: AC
  Administered 2018-10-19: 500 mL via INTRAVENOUS

## 2018-10-19 MED ORDER — ORAL CARE MOUTH RINSE
15.0000 mL | OROMUCOSAL | Status: DC
  Administered 2018-10-19 – 2018-10-20 (×17): 15 mL via OROMUCOSAL

## 2018-10-19 MED ORDER — SODIUM BICARBONATE 8.4 % IV SOLN
100.0000 meq | Freq: Once | INTRAVENOUS | Status: AC
  Administered 2018-10-19: 100 meq via INTRAVENOUS

## 2018-10-19 MED ORDER — MIDAZOLAM HCL 2 MG/2ML IJ SOLN
1.0000 mg | INTRAMUSCULAR | Status: DC | PRN
  Administered 2018-10-19: 1 mg via INTRAVENOUS
  Filled 2018-10-19: qty 2

## 2018-10-19 MED ORDER — CHLORHEXIDINE GLUCONATE CLOTH 2 % EX PADS: 6.0000 | MEDICATED_PAD | Freq: Every day | CUTANEOUS | Status: DC

## 2018-10-19 MED ORDER — FENTANYL 2500MCG IN NS 250ML (10MCG/ML) PREMIX INFUSION
25.0000 ug/h | INTRAVENOUS | Status: DC
  Administered 2018-10-19: 50 ug/h via INTRAVENOUS
  Administered 2018-10-19 – 2018-10-20 (×2): 150 ug/h via INTRAVENOUS
  Filled 2018-10-19 (×3): qty 250

## 2018-10-19 MED ORDER — LACTATED RINGERS IV SOLN
INTRAVENOUS | Status: DC
  Administered 2018-10-19 (×2): via INTRAVENOUS

## 2018-10-19 MED ORDER — ENOXAPARIN SODIUM 30 MG/0.3ML ~~LOC~~ SOLN
30.0000 mg | SUBCUTANEOUS | Status: DC
  Administered 2018-10-19: 30 mg via SUBCUTANEOUS
  Filled 2018-10-19: qty 0.3

## 2018-10-19 MED ORDER — FENTANYL CITRATE (PF) 100 MCG/2ML IJ SOLN: 25.0000 ug | Freq: Once | INTRAMUSCULAR | Status: DC

## 2018-10-19 NOTE — Progress Notes (Signed)
Pt has had soft BP's for the majority of the shift. Dr. Lanney Gins aware of same. Verbal orders were given for  500cc LR boluses x2. [employer] has made several phone calls to this RN this shift despite explaining to them that Dr. Lanney Gins was aware of pt's soft BP's and pt is receiving fluid boluses.

## 2018-10-19 NOTE — Op Note (Signed)
Procedure Date:  10/19/2018  Pre-operative Diagnosis:  Pneumoperitoneum with enteric contrast extravasation  Post-operative Diagnosis:  Small bowel perforation  Procedure:  Exploratory Laparotomy, extensive lysis of adhesions, small bowel resection  Surgeon:  Howie IllJose Luis Azell Bill, MD  Assistant Satira MccallumJason Davis, MD.  His assistance was critical due to the complexity of the surgery, time of day, and need for bowel resection and extensive lysis of adhesions.  Anesthesia:  General endotracheal  Estimated Blood Loss:  30 ml  Specimens: Small bowel, 70 cm length  Complications: Injury to mesentery requiring further bowel resection  Indications for Procedure:  This is a 68 y.o. female now postop day 3 status post laparoscopic cholecystectomy with cystoscopy, right ureteral stent placement.  Her white blood cell count has been elevated and she spiked a fever today as well with worsening abdominal pain and discomfort.  A CT scan was obtained with contrast which showed contrast extravasation from the small bowel concerning for small bowel injury as well as extensive free fluid in the abdomen.  Given these findings, it was discussed with the patient that we should take her back to the operating room for exploratory laparotomy.  The risks of bleeding, abscess or infection, injury to surrounding structures, and need for further procedures were all discussed with the patient and was willing to proceed.  Description of Procedure: The patient was correctly identified in the preoperative area and brought into the operating room.  The patient was placed supine with VTE prophylaxis in place.  Appropriate time-outs were performed.  Anesthesia was induced and the patient was intubated.  Foley catheter was placed.  The abdomen was prepped and draped in a sterile fashion.  A midline incision was made and electrocautery was used to dissect down the subcutaneous tissue to the fascia.  The fascia was incised and extended  superiorly and inferiorly.  The abdomen was explored revealing succus contents intra-abdominally.  There was a 1 cm perforation in the small bowel inferior to the umbilicus port access from her cholecystectomy.  The abdomen was irrigated in order to suction out all the succus contents which was more than 1 L total.  Then we proceeded with extensive lysis of adhesions of more than 90 minutes in order to be able to eviscerate the entire small bowel contents.  In doing so, unfortunately there was a portion of the mesentery proximal to the perforation that was injured and resulted in ischemic small bowel segment.  While trying to determine whether to do 2 separate resections or one longer segment, it was determined that doing 2 separate resections would not leave too much space between segments and thus a longer segment with decided upon.  To do so, window was created in the mesentery both proximally and distally and the segment of small bowel was resected using blue load 75 mm GIA stapler.  LigaSure device was used to cauterize and resect the small bowel off its mesentery.  Total length of small bowel resected was 70 cm.  Then the 2 segments of the small bowel were approximated together using two 3-0 silk sutures and another GIA blue load was used to create a common channel and another load to close the common channel.  The staple lines were imbricated using Lembert sutures with 3-0 silk sutures.  Multiple serosal tears within the small bowel were also repaired primarily using 3-0 silk sutures.  Following that the mesenteric defect was closed as well using 3-0 silk sutures.  The abdomen was thoroughly irrigated again.  A  [place] drain was placed in the right lower quadrant going into the abdomen around the area of repair and down to the pelvis for dependent drainage.  60 mL of Exparel solution combined with Marcaine and saline were infiltrated in the peritoneum, fascia, and subcutaneous tissue.  Then the  abdomen was closed using two #1 PDS sutures.  The subcutaneous tissue was irrigated as well and the skin was closed using staples.  The skin was then cleaned dried and dressed with a honeycomb dressing.  The Blake drain was dressed with a drain sponge and Tegaderm.  Due to the early sepsis the patient was going through, with pressor requirement, small right pneumothorax on CT scan, moderate right pleural effusion, it was decided to leave the patient intubated and sedated.  Patient was brought out of the operating room directly to the intensive care unit for further management.  The patient tolerated the procedure well and all counts were correct at the end of the case.   Melvyn Neth, MD

## 2018-10-19 NOTE — Progress Notes (Signed)
Attempted to place right internal jugular CVL, however unable to advance guidewire.  Therefore, attempted to place right femoral CVL but unable to advance guidewire.  Dr. Hampton Abbot attempting to place CVL.  Will continue to monitor and assess pt.  Marda Stalker, [place] Pager [phone] (please enter 7 digits) PCCM Consult Pager [phone] (please enter 7 digits)

## 2018-10-19 NOTE — Anesthesia Postprocedure Evaluation (Signed)
Anesthesia Post Note  Patient: Holly Dickson  Procedure(s) Performed: EXPLORATORY LAPAROTOMY (N/A ) SMALL BOWEL RESECTION (N/A Abdomen)  Patient location during evaluation: ICU Anesthesia Type: General Level of consciousness: sedated Pain management: pain level controlled Vital Signs Assessment: post-procedure vital signs reviewed and stable Respiratory status: patient on ventilator - see flowsheet for VS Cardiovascular status: stable Postop Assessment: no apparent nausea or vomiting Anesthetic complications: no     Last Vitals:  Vitals:   10/19/18 0700 10/19/18 0719  BP: (!) 89/48   Pulse: 84   Resp: 16   Temp:    SpO2: 100% 100%    Last Pain:  Vitals:   10/19/18 0500  TempSrc: Axillary  PainSc:                  Alison Stalling

## 2018-10-19 NOTE — Progress Notes (Signed)
[facility] Progress Note Patient Name: Holly Dickson DOB: Mar 31, 1951 MRN: [id number]   Date of Service  10/19/2018  HPI/Events of Note  Pt with post-op respiratory failure following exploratory laparotomy to r/o acute abdomen.Pt has a small right sided pneumothorax  eICU Interventions  New patient evaluation. Plan is to review follow up CXR regarding pneumothorax.        Okoronkwo U Ogan 10/19/2018, 1:06 AM

## 2018-10-19 NOTE — Progress Notes (Signed)
Initial Nutrition Assessment  DOCUMENTATION CODES:   Not applicable  INTERVENTION:  Will monitor for plan per Surgery and PCCM. Patient may require initiation of TPN pending return of bowel function as she is at high risk of developing post-operative ileus per Surgery.  NUTRITION DIAGNOSIS:   Inadequate oral intake related to inability to eat as evidenced by NPO status.  GOAL:   Provide needs based on ASPEN/SCCM guidelines  MONITOR:   Vent status, Labs, Weight trends, Skin, I & O's  REASON FOR ASSESSMENT:   Ventilator    ASSESSMENT:   68 year old female with PMHx of HTN who recently underwent laparoscopic cholecystectomy and right ureteral stent placement on 6/27 found to have small bowel perforation s/p exploratory laparotomy with extensive lysis of adhesions and small bowel resection (70 cm of mid to distal ileum but terminal ileum remains intact) on 7/1 and patient transferred to ICU on ventilator post-operatively with concern for developing sepsis and large right pleural effusion.   Patient is intubated and sedated. On PRVC mode with FiO2 40% and PEEP 5 cmH2O. Abdomen distended per RN documentation. Last BM 10/15/2018 per chart. No weight history in chart to trend. Patient does not meet criteria for malnutrition at this time.  IV Access: peripheral IV to right hand and peripheral IV to left hand  Enteral Access: NGT placed 6/30; terminates in stomach per repeat abdominal x-ray 7/1 after repositioning; 65 cm at corner of mouth after repositioning today (had been found coiled in mouth this AM by RN); tube to LIS  MAP: 84-95 mmHg  Patient is currently intubated on ventilator support Ve: 7.1 L/min Temp (24hrs), Avg:97.6 F (36.4 C), Min:96.8 F (36 C), Max:98.6 F (37 C)  Propofol: N/A  Medications reviewed and include: pantoprazole, fentanyl gtt, LR at 100 mL/hr, Zosyn, sodium bicarbonate 150 mEq in D5 at 50 mL/hr.  Labs reviewed: Chloride 115, CO2 14, BUN 30,  Creatinine 2.16, Lactic Acid 2.9.  I/O: 445 mL UOP yesterday (0.3 mL/kg/hr); 100 mL output from right abdominal JP drain yesterday  Discussed with RN and on rounds. Also discussed with Surgeon and Surgical PA over secure chat and appreciate guidance on which part of bowel was removed. Patient will be at risk for post-operative ileus.  NUTRITION - FOCUSED PHYSICAL EXAM:    Most Recent Value  Orbital Region  No depletion  Upper Arm Region  No depletion  Thoracic and Lumbar Region  No depletion  Buccal Region  Unable to assess  Temple Region  No depletion  Clavicle Bone Region  No depletion  Clavicle and Acromion Bone Region  Mild depletion  Scapular Bone Region  Unable to assess  Dorsal Hand  No depletion  Patellar Region  No depletion  Anterior Thigh Region  No depletion  Posterior Calf Region  Unable to assess  Edema (RD Assessment)  -- [non-pitting edema to bilateral lower extremities]  Hair  Reviewed  Eyes  Unable to assess  Mouth  Unable to assess  Skin  Reviewed  Nails  Reviewed     Diet Order:   Diet Order            Diet NPO time specified  Diet effective now             EDUCATION NEEDS:   No education needs have been identified at this time  Skin:  Skin Assessment: Skin Integrity Issues:(closed incision to abdomen)  Last BM:  10/15/2018 per chart  Height:   Ht Readings from Last 1 Encounters:  10/15/18 5\' 4"  (1.626 m)   Weight:   Wt Readings from Last 1 Encounters:  10/15/18 68.9 kg   Ideal Body Weight:  54.5 kg  BMI:  Body mass index is 26.09 kg/m.  Estimated Nutritional Needs:   Kcal:  1357 (PSU 2003b w/ MSJ 1219, Ve 7.1, Tmax 37)  Protein:  90-105 (1.3-1.5 grams/kg)  Fluid:  2 L/day  Willey Blade, MS, RD, LDN Office: [phone] Pager: [phone] After Hours/Weekend Pager: [phone]

## 2018-10-19 NOTE — Progress Notes (Signed)
[facility] SURGICAL PROGRESS NOTE  Hospital Day(s): 2.   Post op day(s): 1 Day Post-Op.   Interval History: Patient seen and examined, remains intubated but is able to follow commands. She has had some hypotension issues post-operatively although has been able to remain off pressors. WBC is improved and continues on Zosyn. Although she did have worsening renal function (sCr - 2.09, U/O 445) and has been somewhat acidotic (lactate - 2.9). CXR showed right large pleural effusion and there are plans for IR consult for thoracentesis. JP in RLQ with 100 ccs out of serosanguinous fluid.    Vital signs in last 24 hours: [min-max] current  Temp:  [96.8 F (36 C)-100.7 F (38.2 C)] 96.8 F (36 C) (07/01 0500) Pulse Rate:  [81-106] 84 (07/01 0700) Resp:  [10-20] 16 (07/01 0700) BP: (89-139)/(48-84) 89/48 (07/01 0700) SpO2:  [91 %-100 %] 100 % (07/01 0719) FiO2 (%):  [40 %] 40 % (07/01 0719)     Height: 5\' 4"  (162.6 cm) Weight: 68.9 kg BMI (Calculated): 26.08   Intake/Output last 2 shifts:  06/30 0701 - 07/01 0700 In: 5711 [P.O.:813; I.V.:4769.6; IV Piggyback:128.5] Out: 1045 [Urine:445; Drains:100]   Physical Exam:  Constitutional: alert, follows commands, NAD  Respiratory: Intubated HEENT: NGT in place although coiled in mouth, will adjust Cardiovascular: regular rate and sinus rhythm  Gastrointestinal: soft, non-tender, and non-distended. No rebound, guarding. JP in RLQ with serosanguinous output Integumentary: Laparotomy incision CDI, honeycomb in place, no erythema or drainage   Labs:  CBC Latest Ref Rng & Units 10/19/2018 10/18/2018 10/17/2018  WBC 4.0 - 10.5 K/uL 13.4(H) 16.3(H) 21.8(H)  Hemoglobin 12.0 - 15.0 g/dL 09.612.7 11.2(L) 10.5(L)  Hematocrit 36.0 - 46.0 % 40.9 35.1(L) 32.3(L)  Platelets 150 - 400 K/uL 398 321 285   CMP Latest Ref Rng & Units 10/19/2018 10/18/2018 10/17/2018  Glucose 70 - 99 mg/dL 045(W112(H) 98 098(J115(H)  BUN 8 - 23 mg/dL 19(J26(H) 22 47(W24(H)  Creatinine  0.44 - 1.00 mg/dL 2.95(A2.09(H) 2.13(Y1.70(H) 8.65(H1.65(H)  Sodium 135 - 145 mmol/L 139 138 135  Potassium 3.5 - 5.1 mmol/L 4.1 3.7 3.7  Chloride 98 - 111 mmol/L 114(H) 111 110  CO2 22 - 32 mmol/L 14(L) 20(L) 19(L)  Calcium 8.9 - 10.3 mg/dL 7.7(L) 8.4(L) 8.1(L)  Total Protein 6.5 - 8.1 g/dL - - -  Total Bilirubin 0.3 - 1.2 mg/dL - - -  Alkaline Phos 38 - 126 U/L - - -  AST 15 - 41 U/L - - -  ALT 0 - 44 U/L - - -     Imaging studies:   CXR (10/19/2018) personally reviewed and radiologist report reviewed:  IMPRESSION: Large right pleural effusion. Diffuse right lung airspace opacities and left basilar opacities, likely atelectasis.   Assessment/Plan:  [age] y.o. female who remains intubated in ICU with hypotension not requiring vasopressors, acidosis, AKI, right pleural effusion, and improving leukocytosis 1 Day Post-Op s/p exploratory laparotomy, lysis of adhesions, and small bowel resection for small bowel perforation   - Remain NPO + NGT Decompression; monitor output; expect ileus of some degree  - Continue IVF hydration  - Continue ABx (Zosyn)   - pain control prn  - monitor abdominal examination; on-going bowel function; JP drain output  - trend renal function; continue foley; monitor output  - monitor leukocytosis; acidosis  - Blood pressure support per PCCM; boluses vs vasopressors; appreciate their assistance  - Ventilator management per PCCM team; wean when appropriate   - Plan for thoracentesis with IR for  right pleural effusion   All of the above findings and recommendations were discussed with the patient, and the medical team, and all of the medical team's questions answered and addressed.   Will update husband via telephone later today.   -- Edison Simon, PA-C [facility] 10/19/2018, 7:41 AM [phone] M-F: 7am - 4pm

## 2018-10-19 NOTE — Op Note (Addendum)
Central Venous Catheter Insertion Procedure Note Holly Dickson [id number] 07-01-1950  Procedure: Insertion of Central Venous Catheter Indications: Assessment of intravascular volume and Drug and/or fluid administration  Procedure Details Consent: Risks of procedure as well as the alternatives and risks of each were explained to the (patient/caregiver).  Consent for procedure obtained. Time Out: Verified patient identification, verified procedure, site/side was marked, verified correct patient position, special equipment/implants available, medications/allergies/relevent history reviewed, required imaging and test results available.  Performed  Maximum sterile technique was used including antiseptics, cap, gloves, gown, hand hygiene, mask and sheet. Skin prep: Chlorhexidine; local anesthetic administered A antimicrobial bonded/coated triple lumen catheter was placed in the right femoral vein due to multiple attempts, no other available access using the Seldinger technique.  Evaluation Blood flow good Complications: No apparent complications Patient did tolerate procedure well. Chest X-ray ordered to verify placement.  CXR: not needed for femoral central line placement.Holly Dickson Holly Dickson 10/19/2018, 11:16 PM

## 2018-10-19 NOTE — Anesthesia Post-op Follow-up Note (Signed)
Anesthesia QCDR form completed.        

## 2018-10-19 NOTE — Transfer of Care (Signed)
Immediate Anesthesia Transfer of Care Note  Patient: Holly Dickson  Procedure(s) Performed: EXPLORATORY LAPAROTOMY (N/A ) SMALL BOWEL RESECTION (N/A Abdomen)  Patient Location: PACU and ICU  Anesthesia Type:General  Level of Consciousness: Patient remains intubated per anesthesia plan  Airway & Oxygen Therapy: Patient placed on Ventilator (see vital sign flow sheet for setting)  Post-op Assessment: Report given to RN and Post -op Vital signs reviewed and stable  Post vital signs: Reviewed and stable  Last Vitals:  Vitals Value Taken Time  BP    Temp    Pulse 81 10/19/18 0039  Resp 23 10/19/18 0040  SpO2 97 % 10/19/18 0039  Vitals shown include unvalidated device data.  Last Pain:  Vitals:   10/18/18 1856  TempSrc:   PainSc: 7       Patients Stated Pain Goal: 2 (00/76/22 6333)  Complications: No apparent anesthesia complications

## 2018-10-20 ENCOUNTER — Inpatient Hospital Stay: Payer: Medicare HMO

## 2018-10-20 LAB — GLUCOSE, CAPILLARY
Glucose-Capillary: 111 mg/dL — ABNORMAL HIGH (ref 70–99)
Glucose-Capillary: 101 mg/dL — ABNORMAL HIGH (ref 70–99)
Glucose-Capillary: 112 mg/dL — ABNORMAL HIGH (ref 70–99)
Glucose-Capillary: 108 mg/dL — ABNORMAL HIGH (ref 70–99)

## 2018-10-20 LAB — CBC WITH DIFFERENTIAL/PLATELET
Abs Immature Granulocytes: 0.13 10*3/uL — ABNORMAL HIGH (ref 0.00–0.07)
Basophils Absolute: 0 10*3/uL (ref 0.0–0.1)
Basophils Relative: 0 %
Eosinophils Absolute: 0.1 10*3/uL (ref 0.0–0.5)
Eosinophils Relative: 1 %
HCT: 28.6 % — ABNORMAL LOW (ref 36.0–46.0)
Hemoglobin: 9.3 g/dL — ABNORMAL LOW (ref 12.0–15.0)
Immature Granulocytes: 1 %
Lymphocytes Relative: 7 %
Lymphs Abs: 1 10*3/uL (ref 0.7–4.0)
MCH: 28.9 pg (ref 26.0–34.0)
MCHC: 32.5 g/dL (ref 30.0–36.0)
MCV: 88.8 fL (ref 80.0–100.0)
Monocytes Absolute: 0.8 10*3/uL (ref 0.1–1.0)
Monocytes Relative: 6 %
Neutro Abs: 11.8 10*3/uL — ABNORMAL HIGH (ref 1.7–7.7)
Neutrophils Relative %: 85 %
Platelets: 375 10*3/uL (ref 150–400)
RBC: 3.22 MIL/uL — ABNORMAL LOW (ref 3.87–5.11)
RDW: 14.1 % (ref 11.5–15.5)
WBC: 13.8 10*3/uL — ABNORMAL HIGH (ref 4.0–10.5)
nRBC: 0 % (ref 0.0–0.2)

## 2018-10-20 LAB — BASIC METABOLIC PANEL
Anion gap: 8 (ref 5–15)
Anion gap: 7 (ref 5–15)
BUN: 32 mg/dL — ABNORMAL HIGH (ref 8–23)
BUN: 33 mg/dL — ABNORMAL HIGH (ref 8–23)
CO2: 23 mmol/L (ref 22–32)
CO2: 23 mmol/L (ref 22–32)
Calcium: 7.4 mg/dL — ABNORMAL LOW (ref 8.9–10.3)
Calcium: 7.3 mg/dL — ABNORMAL LOW (ref 8.9–10.3)
Chloride: 109 mmol/L (ref 98–111)
Chloride: 111 mmol/L (ref 98–111)
Creatinine, Ser: 2.28 mg/dL — ABNORMAL HIGH (ref 0.44–1.00)
Creatinine, Ser: 2.2 mg/dL — ABNORMAL HIGH (ref 0.44–1.00)
GFR calc Af Amer: 25 mL/min — ABNORMAL LOW (ref >60)
GFR calc Af Amer: 26 mL/min — ABNORMAL LOW (ref >60)
GFR calc non Af Amer: 22 mL/min — ABNORMAL LOW (ref >60)
GFR calc non Af Amer: 23 mL/min — ABNORMAL LOW (ref >60)
Glucose, Bld: 135 mg/dL — ABNORMAL HIGH (ref 70–99)
Glucose, Bld: 112 mg/dL — ABNORMAL HIGH (ref 70–99)
Potassium: 2.9 mmol/L — ABNORMAL LOW (ref 3.5–5.1)
Potassium: 3.3 mmol/L — ABNORMAL LOW (ref 3.5–5.1)
Sodium: 140 mmol/L (ref 135–145)
Sodium: 141 mmol/L (ref 135–145)

## 2018-10-20 LAB — ALBUMIN: Albumin: 1.6 g/dL — ABNORMAL LOW (ref 3.5–5.0)

## 2018-10-20 LAB — BLOOD GAS, ARTERIAL
Acid-base deficit: 0.2 mmol/L (ref 0.0–2.0)
Bicarbonate: 24 mmol/L (ref 20.0–28.0)
FIO2: 0.4
MECHVT: 450 mL
Mechanical Rate: 16
O2 Saturation: 96.8 %
PEEP: 5 cmH2O
Patient temperature: 37
pCO2 arterial: 37 mmHg (ref 32.0–48.0)
pH, Arterial: 7.42 (ref 7.350–7.450)
pO2, Arterial: 87 mmHg (ref 83.0–108.0)

## 2018-10-20 LAB — MRSA PCR SCREENING: MRSA by PCR: NEGATIVE

## 2018-10-20 LAB — CORTISOL: Cortisol, Plasma: >100 ug/dL

## 2018-10-20 LAB — MAGNESIUM: Magnesium: 1.6 mg/dL — ABNORMAL LOW (ref 1.7–2.4)

## 2018-10-20 LAB — PROCALCITONIN: Procalcitonin: 3.13 ng/mL

## 2018-10-20 LAB — SURGICAL PATHOLOGY

## 2018-10-20 LAB — PHOSPHORUS: Phosphorus: 2.1 mg/dL — ABNORMAL LOW (ref 2.5–4.6)

## 2018-10-20 MED ORDER — HYDROCORTISONE NA SUCCINATE PF 100 MG IJ SOLR
100.0000 mg | Freq: Three times a day (TID) | INTRAMUSCULAR | Status: AC
  Administered 2018-10-20 – 2018-10-21 (×5): 100 mg via INTRAVENOUS
  Filled 2018-10-20 (×4): qty 2

## 2018-10-20 MED ORDER — MAGNESIUM SULFATE 4 GM/100ML IV SOLN
4.0000 g | Freq: Once | INTRAVENOUS | Status: AC
  Administered 2018-10-20: 4 g via INTRAVENOUS
  Filled 2018-10-20: qty 100

## 2018-10-20 MED ORDER — ESCITALOPRAM OXALATE 10 MG PO TABS
10.0000 mg | ORAL_TABLET | Freq: Every day | ORAL | Status: DC
  Administered 2018-10-22 – 2018-10-26 (×5): 10 mg via ORAL
  Filled 2018-10-20 (×6): qty 1

## 2018-10-20 MED ORDER — HYDROCORTISONE NA SUCCINATE PF 100 MG IJ SOLR: 100.0000 mg | Freq: Two times a day (BID) | INTRAMUSCULAR | Status: DC

## 2018-10-20 MED ORDER — MAGNESIUM SULFATE 2 GM/50ML IV SOLN: 2.0000 g | Freq: Once | INTRAVENOUS | Status: DC

## 2018-10-20 MED ORDER — ORAL CARE MOUTH RINSE
15.0000 mL | Freq: Two times a day (BID) | OROMUCOSAL | Status: DC
  Administered 2018-10-20 – 2018-10-25 (×6): 15 mL via OROMUCOSAL

## 2018-10-20 MED ORDER — MORPHINE SULFATE (PF) 2 MG/ML IV SOLN
1.0000 mg | INTRAVENOUS | Status: DC | PRN
  Administered 2018-10-21 – 2018-10-26 (×2): 2 mg via INTRAVENOUS
  Filled 2018-10-20 (×2): qty 1

## 2018-10-20 MED ORDER — HEPARIN SODIUM (PORCINE) 5000 UNIT/ML IJ SOLN
5000.0000 [IU] | Freq: Three times a day (TID) | INTRAMUSCULAR | Status: DC
  Administered 2018-10-20 – 2018-10-23 (×10): 5000 [IU] via SUBCUTANEOUS
  Filled 2018-10-20 (×10): qty 1

## 2018-10-20 MED ORDER — POTASSIUM CHLORIDE 10 MEQ/50ML IV SOLN
10.0000 meq | INTRAVENOUS | Status: AC
  Administered 2018-10-20 (×4): 10 meq via INTRAVENOUS
  Filled 2018-10-20 (×4): qty 50

## 2018-10-20 MED ORDER — POTASSIUM CHLORIDE 2 MEQ/ML IV SOLN
INTRAVENOUS | Status: DC
  Filled 2018-10-20 (×2): qty 1000

## 2018-10-20 MED ORDER — POTASSIUM CHLORIDE 10 MEQ/50ML IV SOLN
10.0000 meq | INTRAVENOUS | Status: AC
  Administered 2018-10-20 (×3): 10 meq via INTRAVENOUS
  Filled 2018-10-20 (×3): qty 50

## 2018-10-20 MED ORDER — INSULIN ASPART 100 UNIT/ML ~~LOC~~ SOLN
0.0000 [IU] | SUBCUTANEOUS | Status: DC
  Administered 2018-10-21: 1 [IU] via SUBCUTANEOUS
  Administered 2018-10-21: 2 [IU] via SUBCUTANEOUS
  Filled 2018-10-20 (×2): qty 1

## 2018-10-20 MED ORDER — POTASSIUM CHLORIDE 10 MEQ/50ML IV SOLN
10.0000 meq | INTRAVENOUS | Status: DC
  Administered 2018-10-20: 10 meq via INTRAVENOUS
  Filled 2018-10-20 (×2): qty 50

## 2018-10-20 MED ORDER — ALBUMIN HUMAN 25 % IV SOLN
12.5000 g | Freq: Once | INTRAVENOUS | Status: AC
  Administered 2018-10-20: 12.5 g via INTRAVENOUS
  Filled 2018-10-20: qty 50

## 2018-10-20 NOTE — Progress Notes (Signed)
CRITICAL CARE NOTE      CHIEF COMPLAINT:   68 year old female with acute cholecystitis & hydronephorsis due to nephrolithiasis s/p Lap chole with cystoscopy and R ureteral stent. Post op devt of fever with abd pain, Imaging via CT showed bowel perf and tiny R apical pneumo. Next taken to OR for exLap s/p sm bowel resec then to MICU for medical optimization.    SUBJECTIVE FINDINGS & SIGNIFICANT EVENTS   Patient remains critically ill, post aggressive IVF resuscitation required central line placement with pressor support.   PAST MEDICAL HISTORY   Past Medical History:  Diagnosis Date  . Hypertension      SURGICAL HISTORY   Past Surgical History:  Procedure Laterality Date  . BOWEL RESECTION N/A 10/18/2018   Procedure: SMALL BOWEL RESECTION;  Surgeon: Vickie Epley, MD;  Location: [facility] ORS;  Service: General;  Laterality: N/A;  . CHOLECYSTECTOMY N/A 10/15/2018   Procedure: LAPAROSCOPIC CHOLECYSTECTOMY with umbilical hernia repair;  Surgeon: Olean Ree, MD;  Location: [facility] ORS;  Service: General;  Laterality: N/A;  . CYSTOSCOPY WITH STENT PLACEMENT Right 10/15/2018   Procedure: CYSTOSCOPY WITH STENT PLACEMENT,right retrograde pyelogram;  Surgeon: Ardis Hughs, MD;  Location: [facility] ORS;  Service: Urology;  Laterality: Right;  . LAPAROTOMY N/A 10/18/2018   Procedure: EXPLORATORY LAPAROTOMY;  Surgeon: Vickie Epley, MD;  Location: [facility] ORS;  Service: General;  Laterality: N/A;     FAMILY HISTORY   History reviewed. No pertinent family history.   SOCIAL HISTORY   Social History   Tobacco Use  . Smoking status: Never Smoker  . Smokeless tobacco: Never Used  Substance Use Topics  . Alcohol use: Not Currently  . Drug use: Never     MEDICATIONS   Current Medication:  Current  Facility-Administered Medications:  .  0.9 %  sodium chloride infusion, , Intravenous, PRN, Olean Ree, MD, Stopped at 10/19/18 0149 .  chlorhexidine gluconate (MEDLINE KIT) (PERIDEX) 0.12 % solution 15 mL, 15 mL, Mouth Rinse, BID, Darel Hong D, NP, 15 mL at 10/19/18 2009 .  Chlorhexidine Gluconate Cloth 2 % PADS 6 each, 6 each, Topical, Daily, Ottie Glazier, MD, 6 each at 10/19/18 2224 .  enoxaparin (LOVENOX) injection 30 mg, 30 mg, Subcutaneous, Q24H, Charlett Nose, RPH .  escitalopram (LEXAPRO) tablet 10 mg, 10 mg, Per Tube, Daily, Charlett Nose, RPH .  fentaNYL (SUBLIMAZE) bolus via infusion 25 mcg, 25 mcg, Intravenous, Q15 min PRN, Darel Hong D, NP .  fentaNYL (SUBLIMAZE) bolus via infusion 50 mcg, 50 mcg, Intravenous, Once, Blakeney, Dreama Saa, NP .  fentaNYL (SUBLIMAZE) injection 25 mcg, 25 mcg, Intravenous, Once, Darel Hong D, NP .  fentaNYL 2544mg in NS 2567m(1061mml) infusion-PREMIX, 25-200 mcg/hr, Intravenous, Continuous, KeeDarel Hong NP, Last Rate: 15 mL/hr at 10/20/18 0647, 150 mcg/hr at 10/20/18 0647 .  ipratropium-albuterol (DUONEB) 0.5-2.5 (3) MG/3ML nebulizer solution 3 mL, 3 mL, Nebulization, Q4H PRN, KeeBradly BienenstockP .  lactated ringers infusion, , Intravenous, Continuous, KeeDarel Hong NP, Last Rate: 100 mL/hr at 10/19/18 2028 .  MEDLINE mouth rinse, 15 mL, Mouth Rinse, 10 times per day, KeeDarel Hong NP, 15 mL at 10/20/18 0544 .  midazolam (VERSED) injection 1 mg, 1 mg, Intravenous, Q15 min PRN, KeeDarel Hong NP, 1 mg at 10/19/18 0424 .  midazolam (VERSED) injection 1 mg, 1 mg, Intravenous, Q2H PRN, KeeDarel Hong NP .  norepinephrine (LEVOPHED) 16 mg in dextrose 5 % 250 mL (0.064 mg/mL) infusion, 0-40  mcg/min, Intravenous, Titrated, Awilda Bill, NP, Last Rate: 3.75 mL/hr at 10/20/18 0647, 4 mcg/min at 10/20/18 0647 .  ondansetron (ZOFRAN-ODT) disintegrating tablet 4 mg, 4 mg, Oral, Q6H PRN **OR** ondansetron  (ZOFRAN) injection 4 mg, 4 mg, Intravenous, Q6H PRN, Piscoya, Jose, MD, 4 mg at 10/18/18 1450 .  pantoprazole (PROTONIX) injection 40 mg, 40 mg, Intravenous, QHS, Piscoya, Jose, MD, 40 mg at 10/19/18 2224 .  piperacillin-tazobactam (ZOSYN) IVPB 3.375 g, 3.375 g, Intravenous, Q8H, Piscoya, Jose, MD, Last Rate: 12.5 mL/hr at 10/20/18 0322, 3.375 g at 10/20/18 0322 .  potassium chloride 10 mEq in 50 mL *CENTRAL LINE* IVPB, 10 mEq, Intravenous, Q1 Hr x 4, Darel Hong D, NP, Last Rate: 50 mL/hr at 10/20/18 0647, 10 mEq at 10/20/18 0647 .  sodium bicarbonate 150 mEq in dextrose 5 % 1,000 mL infusion, , Intravenous, Continuous, Darel Hong D, NP, Last Rate: 50 mL/hr at 10/20/18 0071    ALLERGIES   Patient has no known allergies.    REVIEW OF SYSTEMS     Unable to obtain due to sedation on mechanical ventilation.   PHYSICAL EXAMINATION   Vitals:   10/20/18 0600 10/20/18 0630  BP: (!) 100/54 102/62  Pulse: 78 78  Resp: (!) 23 (!) 33  Temp:    SpO2: 100% 100%    GENERAL:NAD sedated RASS -1 HEAD: Normocephalic, atraumatic.  EYES: Pupils equal, round, reactive to light.  No scleral icterus.  MOUTH: Moist mucosal membrane. NECK: Supple. No thyromegaly. No nodules. No JVD.  PULMONARY: R pl effusion CARDIOVASCULAR: S1 and S2. Regular rate and rhythm. No murmurs, rubs, or gallops.  GASTROINTESTINAL: Soft, nontender, non-distended. No masses. Positive bowel sounds. No hepatosplenomegaly.  MUSCULOSKELETAL: No swelling, clubbing, or edema.  NEUROLOGIC: Mild distress due to acute illness SKIN:intact,warm,dry   LABS AND IMAGING       LAB RESULTS: Recent Labs  Lab 10/19/18 0114 10/19/18 0801 10/20/18 0318  NA 139 143 140  K 4.1 4.8 2.9*  CL 114* 115* 109  CO2 14* 14* 23  BUN 26* 30* 32*  CREATININE 2.09* 2.16* 2.28*  GLUCOSE 112* 158* 135*   Recent Labs  Lab 10/18/18 0410 10/19/18 0114 10/20/18 0318  HGB 11.2* 12.7 9.3*  HCT 35.1* 40.9 28.6*  WBC 16.3* 13.4*  13.8*  PLT 321 398 375     IMAGING RESULTS: Dg Chest Port 1 View  Result Date: 10/20/2018 CLINICAL DATA:  Respiratory failure EXAM: PORTABLE CHEST 1 VIEW COMPARISON:  10/19/2018 FINDINGS: Endotracheal tube tip is 4.3 cm superior to the carina. Esophageal tube tip below the diaphragm but non included. Large right-sided pleural effusion without significant change. Small left-sided pleural effusion. No change in left basilar consolidation and diffuse hazy opacity in the right thorax. Stable enlarged cardiomediastinal silhouette. IMPRESSION: 1. Support lines and tubes as above 2. No significant interval change in moderate to large right pleural effusion and small left effusion with diffuse opacity in the right thorax, likely due to layering effusion 3. Persistent consolidation in the left lung base. Stable cardiomediastinal silhouette with vascular congestion Electronically Signed   By: Donavan Foil M.D.   On: 10/20/2018 02:55   Dg Chest Port 1 View  Result Date: 10/19/2018 CLINICAL DATA:  68 year old female central line placement. EXAM: PORTABLE CHEST 1 VIEW COMPARISON:  0052 hours today. FINDINGS: Portable AP semi upright view at at 1909 hours. Stable endotracheal tube tip at the level the clavicles. Enteric tube courses to the abdomen, tip not included now. No vascular catheter identified.  Continued veiling opacity in the right lung compatible with moderate to large pleural effusion. Stable cardiac size and mediastinal contours. Continued retrocardiac opacity on the left suggesting lower lobe collapse or consolidation. Overall ventilation has not significantly changed. Paucity of bowel gas in the upper abdomen. IMPRESSION: 1. Stable lines and tubes. No central line catheter identified. 2. Continued moderate to large right pleural effusion and left lower lobe collapse or consolidation. Electronically Signed   By: Genevie Ann M.D.   On: 10/19/2018 19:32   Dg Abd Portable 1v  Result Date: 10/19/2018 CLINICAL  DATA:  NG placement EXAM: PORTABLE ABDOMEN - 1 VIEW COMPARISON:  CT abdomen 10/18/2018 FINDINGS: NG tube in the body of the stomach.  Normal bowel gas pattern Right ureteral stent. Approximately 8 x 12 mm calculus proximal right ureter. Left renal calculus approximately 12 x 20 mm. IMPRESSION: NG tube in the stomach.  Normal bowel gas pattern Bilateral renal calculi.  Right ureteral stent. Electronically Signed   By: Franchot Gallo M.D.   On: 10/19/2018 11:41      ASSESSMENT AND PLAN    -Multidisciplinary rounds held today  Acute Hypoxic Respiratory Failure -due to compressive atelectasis from large R pleural effusion -continue Full MV support -continue Bronchodilator Therapy -Wean Fio2 and PEEP as tolerated -will perform SAT/SBT when respiratory parameters are met   S/p bowel resection POD2 -on levophed this for non fluid responsive circulatory shock -Cortisol level ordered, Solucortef 100 bid starting today - possible adrenal insuffici -oxygen as needed -follow up cardiac enzymes as indicated ICU monitoring  Large Right pleural effusion with compressive atelectasis  IR has been following patient and there is plan for thoracentesis   Acute Renal Failure Stage II-most likely due to ATN -worsening creatinine likely due to transient hypotension -follow chem 7 -follow UO -continue Foley Catheter-assess need daily   NEUROLOGY - intubated and sedated - minimal sedation to achieve a RASS goal: -1 Wake up assessment pending    ID -continue IV abx as prescibed -follow up cultures  GI/Nutrition GI PROPHYLAXIS as indicated DIET-->TF's as tolerated Constipation protocol as indicated  ENDO - ICU hypoglycemic\Hyperglycemia protocol -check FSBS per protocol   ELECTROLYTES -follow labs as needed -replace as needed -pharmacy consultation   DVT/GI PRX ordered -SCDs  TRANSFUSIONS AS NEEDED MONITOR FSBS ASSESS the need for LABS as needed   Critical care provider  statement:    Critical care time (minutes):  32   Critical care time was exclusive of:  Separately billable procedures and treating other patients   Critical care was time spent personally by me on the following activities:  Development of treatment plan with patient or surrogate, discussions with consultants, evaluation of patient's response to treatment, examination of patient, obtaining history from patient or surrogate, ordering and performing treatments and interventions, ordering and review of laboratory studies and re-evaluation of patient's condition.  I assumed direction of critical care for this patient from another provider in my specialty: no    This document was prepared using Dragon voice recognition software and may include unintentional dictation errors.    Ottie Glazier, M.D.  Division of [place]

## 2018-10-20 NOTE — Progress Notes (Signed)
[facility] SURGICAL PROGRESS NOTE  Hospital Day(s): 3.   Post op day(s): 2 Days Post-Op.   Interval History: Patient seen and examined in the ICU and remains intubated but arouses to verbal stimuli and answers questions. No complaints of abdominal pain. She had soft blood pressures throughout the day yesterday and CVL was place in right femoral vein yesterday evening and she was started on levophed. BP has improved this morning and only requiring 3 mcg at time of examination. Her leukocytosis has continued to improve. Renal function slightly worse (sCr 2.20) and U/O ~400 last shift. Hypokalemic this morning. NGT with on 100 ccs out. JP with 240 ccs of serosanguinous output. ABG has normalized.  K+ 2.9   Vital signs in last 24 hours: [min-max] current  Temp:  [97.6 F (36.4 C)-99.4 F (37.4 C)] 99.4 F (37.4 C) (07/02 0325) Pulse Rate:  [77-95] 78 (07/02 0630) Resp:  [7-33] 33 (07/02 0630) BP: (66-108)/(39-77) 102/62 (07/02 0630) SpO2:  [90 %-100 %] 100 % (07/02 0630) FiO2 (%):  [40 %] 40 % (07/02 0412)     Height: 5\' 4"  (162.6 cm) Weight: 68.9 kg BMI (Calculated): 26.08   Intake/Output last 2 shifts:  07/01 0701 - 07/02 0700 In: 3671 [I.V.:2142; IV Piggyback:1529] Out: 690 [Urine:345; Emesis/NG output:100; Drains:245]   Physical Exam:  Constitutional: alert, follows commands, NAD  Respiratory: Intubated HEENT: NGT in place Cardiovascular: regular rate and sinus rhythm  Gastrointestinal: soft, non-tender, and non-distended. No rebound, guarding. JP in RLQ with serosanguinous output Integumentary: Laparotomy incision CDI, honeycomb in place, no erythema or drainage. Previous laparoscopic incisions healing well. Central line in right femoral vein no surrounding erythema.    Labs:  CBC Latest Ref Rng & Units 10/20/2018 10/19/2018 10/18/2018  WBC 4.0 - 10.5 K/uL 13.8(H) 13.4(H) 16.3(H)  Hemoglobin 12.0 - 15.0 g/dL 1.6(X9.3(L) 09.612.7 11.2(L)  Hematocrit 36.0 - 46.0 % 28.6(L)  40.9 35.1(L)  Platelets 150 - 400 K/uL 375 398 321   CMP Latest Ref Rng & Units 10/20/2018 10/19/2018 10/19/2018  Glucose 70 - 99 mg/dL 045(W135(H) 098(J158(H) 191(Y112(H)  BUN 8 - 23 mg/dL 78(G32(H) 95(A30(H) 21(H26(H)  Creatinine 0.44 - 1.00 mg/dL 0.86(V2.28(H) 7.84(O2.16(H) 9.62(X2.09(H)  Sodium 135 - 145 mmol/L 140 143 139  Potassium 3.5 - 5.1 mmol/L 2.9(L) 4.8 4.1  Chloride 98 - 111 mmol/L 109 115(H) 114(H)  CO2 22 - 32 mmol/L 23 14(L) 14(L)  Calcium 8.9 - 10.3 mg/dL 7.4(L) 7.8(L) 7.7(L)  Total Protein 6.5 - 8.1 g/dL - - -  Total Bilirubin 0.3 - 1.2 mg/dL - - -  Alkaline Phos 38 - 126 U/L - - -  AST 15 - 41 U/L - - -  ALT 0 - 44 U/L - - -     Imaging studies:   CXR (10/20/2018) personally reviewed and radiologist report reviewed:  IMPRESSION: 1. Support lines and tubes as above 2. No significant interval change in moderate to large right pleural effusion and small left effusion with diffuse opacity in the right thorax, likely due to layering effusion 3. Persistent consolidation in the left lung base. Stable cardiomediastinal silhouette with vascular congestion   Assessment/Plan: 68 y.o. female who remains intubated in ICU with hypotension weaning from vasopressor support, resolved acidosis, and improving leukocytosis, still with AKI and decreased U/O 2 Days Post-Op s/p exploratory laparotomy, lysis of adhesions, and small bowel resection for small bowel perforation.   - Remain NPO + NGT Decompression; monitor output; expect ileus of some degree             -  Continue IVF hydration; continue LR; okay to d/c bicarb as acidosis resolved             - Continue ABx (Zosyn)              - pain control prn             - monitor abdominal examination; on-going bowel function; JP drain output             - trend renal function; continue foley; monitor output   - blood pressor and ventilator support by PCCM; appreciate their help; wean vasopressor support; wean to extubate when feasible  - Possible thoracentesis with IR   All of  the above findings and recommendations were discussed with the medical team  -- Edison Simon, PA-C [facility] 10/20/2018, 7:22 AM [phone] M-F: 7am - 4pm

## 2018-10-20 NOTE — Progress Notes (Signed)
Cuff leak noted.  Patient extubated and placed on 2lpm .  Patient tolerated well.

## 2018-10-20 NOTE — Progress Notes (Signed)
Pt successfully extubated currently on 2L via nasal canula O2 sats uper 90's and no signs of respiratory distress.  She denies pain.  Marda Stalker, [place] Pager [phone] (please enter 7 digits) PCCM Consult Pager [phone] (please enter 7 digits)

## 2018-10-21 ENCOUNTER — Inpatient Hospital Stay: Payer: Medicare HMO

## 2018-10-21 LAB — CBC WITH DIFFERENTIAL/PLATELET
Abs Immature Granulocytes: 0.23 10*3/uL — ABNORMAL HIGH (ref 0.00–0.07)
Basophils Absolute: 0 10*3/uL (ref 0.0–0.1)
Basophils Relative: 0 %
Eosinophils Absolute: 0 10*3/uL (ref 0.0–0.5)
Eosinophils Relative: 0 %
HCT: 28.1 % — ABNORMAL LOW (ref 36.0–46.0)
Hemoglobin: 8.9 g/dL — ABNORMAL LOW (ref 12.0–15.0)
Immature Granulocytes: 2 %
Lymphocytes Relative: 4 %
Lymphs Abs: 0.6 10*3/uL — ABNORMAL LOW (ref 0.7–4.0)
MCH: 28.5 pg (ref 26.0–34.0)
MCHC: 31.7 g/dL (ref 30.0–36.0)
MCV: 90.1 fL (ref 80.0–100.0)
Monocytes Absolute: 0.7 10*3/uL (ref 0.1–1.0)
Monocytes Relative: 5 %
Neutro Abs: 13.4 10*3/uL — ABNORMAL HIGH (ref 1.7–7.7)
Neutrophils Relative %: 89 %
Platelets: 335 10*3/uL (ref 150–400)
RBC: 3.12 MIL/uL — ABNORMAL LOW (ref 3.87–5.11)
RDW: 14.6 % (ref 11.5–15.5)
WBC: 14.9 10*3/uL — ABNORMAL HIGH (ref 4.0–10.5)
nRBC: 0 % (ref 0.0–0.2)

## 2018-10-21 LAB — BASIC METABOLIC PANEL
Anion gap: 11 (ref 5–15)
BUN: 39 mg/dL — ABNORMAL HIGH (ref 8–23)
CO2: 22 mmol/L (ref 22–32)
Calcium: 8.2 mg/dL — ABNORMAL LOW (ref 8.9–10.3)
Chloride: 109 mmol/L (ref 98–111)
Creatinine, Ser: 2.39 mg/dL — ABNORMAL HIGH (ref 0.44–1.00)
GFR calc Af Amer: 24 mL/min — ABNORMAL LOW (ref >60)
GFR calc non Af Amer: 20 mL/min — ABNORMAL LOW (ref >60)
Glucose, Bld: 111 mg/dL — ABNORMAL HIGH (ref 70–99)
Potassium: 4.3 mmol/L (ref 3.5–5.1)
Sodium: 142 mmol/L (ref 135–145)

## 2018-10-21 LAB — GLUCOSE, CAPILLARY
Glucose-Capillary: 94 mg/dL (ref 70–99)
Glucose-Capillary: 99 mg/dL (ref 70–99)
Glucose-Capillary: 103 mg/dL — ABNORMAL HIGH (ref 70–99)
Glucose-Capillary: 148 mg/dL — ABNORMAL HIGH (ref 70–99)
Glucose-Capillary: 160 mg/dL — ABNORMAL HIGH (ref 70–99)

## 2018-10-21 LAB — MAGNESIUM: Magnesium: 3 mg/dL — ABNORMAL HIGH (ref 1.7–2.4)

## 2018-10-21 LAB — PROCALCITONIN: Procalcitonin: 1.82 ng/mL

## 2018-10-21 LAB — PHOSPHORUS: Phosphorus: 4.6 mg/dL (ref 2.5–4.6)

## 2018-10-21 LAB — ALBUMIN: Albumin: 2.1 g/dL — ABNORMAL LOW (ref 3.5–5.0)

## 2018-10-21 MED ORDER — ALBUMIN HUMAN 25 % IV SOLN
12.5000 g | Freq: Once | INTRAVENOUS | Status: AC
  Administered 2018-10-21: 12.5 g via INTRAVENOUS
  Filled 2018-10-21: qty 50

## 2018-10-21 MED ORDER — FUROSEMIDE 10 MG/ML IJ SOLN
20.0000 mg | Freq: Once | INTRAMUSCULAR | Status: AC
  Administered 2018-10-21: 20 mg via INTRAVENOUS
  Filled 2018-10-21: qty 2

## 2018-10-21 MED ORDER — MIDODRINE HCL 5 MG PO TABS
5.0000 mg | ORAL_TABLET | Freq: Three times a day (TID) | ORAL | Status: DC
  Filled 2018-10-21 (×2): qty 1

## 2018-10-21 NOTE — Progress Notes (Signed)
SURGICAL PROGRESS NOTE  Hospital Day(s): 4.   Post op day(s): 3 Days Post-Op.   Interval History: Patient seen and examined, has had some respiratory difficulty since extubated, but otherwise hemodynamically stable since vasopressors discontinued. Patient's RN reports only 150 mL from NG tube overnight, and patient denies abdominal pain, N/V, abdominal distention/bloating, flatus, fever/chills, or CP.  Review of Systems:  Constitutional: denies fever, chills  Respiratory: denies any shortness of breath  Cardiovascular: denies chest pain or palpitations  Gastrointestinal: abdominal pain, N/V, and bowel function as per interval history Musculoskeletal: denies pain, decreased motor or sensation Integumentary: denies any other rashes or skin discolorations except post-surgical abdominal wound  Vital signs in last 24 hours: [min-max] current  Temp:  [98.6 F (37 C)-100 F (37.8 C)] 100 F (37.8 C) (07/03 0800) Pulse Rate:  [86-113] 86 (07/03 1100) Resp:  [12-56] 22 (07/03 1100) BP: (103-138)/(66-84) 129/78 (07/03 1100) SpO2:  [92 %-99 %] 97 % (07/03 1100) FiO2 (%):  [50 %-52 %] 50 % (07/03 1100)     Height: 5\' 4"  (162.6 cm) Weight: 68.9 kg BMI (Calculated): 26.08   Intake/Output this shift:  Total I/O In: 56 [NG/GT:40; IV Piggyback:16] Out: 235 [Urine:235]   Intake/Output last 2 shifts:  @IOLAST2SHIFTS @   Physical Exam:  Constitutional: alert, cooperative and no distress  Respiratory: breathing non-labored with high-flow nasal cannula Cardiovascular: regular rate and sinus rhythm  Gastrointestinal: soft, non-tender, and non-distended, abdominal post-surgical incision well-approximated without surrounding erythema or drainage  Labs:  CBC Latest Ref Rng & Units 10/21/2018 10/20/2018 10/19/2018  WBC 4.0 - 10.5 K/uL 14.9(H) 13.8(H) 13.4(H)  Hemoglobin 12.0 - 15.0 g/dL 1.6(X8.9(L) 0.9(U9.3(L) 04.512.7  Hematocrit 36.0 - 46.0 % 28.1(L) 28.6(L) 40.9  Platelets 150 - 400 K/uL 335 375 398   CMP  Latest Ref Rng & Units 10/21/2018 10/20/2018 10/20/2018  Glucose 70 - 99 mg/dL 409(W111(H) 119(J112(H) 478(G135(H)  BUN 8 - 23 mg/dL 95(A39(H) 21(H33(H) 08(M32(H)  Creatinine 0.44 - 1.00 mg/dL 5.78(I2.39(H) 6.96(E2.20(H) 9.52(W2.28(H)  Sodium 135 - 145 mmol/L 142 141 140  Potassium 3.5 - 5.1 mmol/L 4.3 3.3(L) 2.9(L)  Chloride 98 - 111 mmol/L 109 111 109  CO2 22 - 32 mmol/L 22 23 23   Calcium 8.9 - 10.3 mg/dL 8.2(L) 7.3(L) 7.4(L)  Total Protein 6.5 - 8.1 g/dL - - -  Total Bilirubin 0.3 - 1.2 mg/dL - - -  Alkaline Phos 38 - 126 U/L - - -  AST 15 - 41 U/L - - -  ALT 0 - 44 U/L - - -   Imaging studies:  Chest X-ray (10/21/2018) Moderate right pleural effusion, unchanged from yesterday. Opacity throughout the entire right hemithorax seen on chest x-ray earlier this morning reflects layering of the pleural effusion in the supine position.  Assessment/Plan: 68 y.o. female with respiratory difficulty, post-surgical ileus, AKI, and resolving septic shock 3 Days Post-Op s/p laparotomy with small bowel resection x 70 cm for enterotomy s/p laparoscopic cholecystectomy and placement of ureteral stent 3 days earlier.   - respiratory support as per ICU (appreciated), incentive spirometry  - pleural effusion reportedly not amenable to thoracentesis per IR (per RN)  - currently normotensive with vasopressors off, continue same as tolerated  - if NGT output remains low, consider clamp trial, check residuals, and possible removal of NG tube with starting sips of clear liquids if remains off vasopressors   - continue to monitor abdominal exam and bowel function  - medical management of comorbidities  - DVT prophylaxis, ambulate  All of  the above findings and recommendations were discussed with the patient and ICU RN, and all of patient's questions were answered to her expressed satisfaction.  -- Marilynne Drivers Rosana Hoes, MD, [place]: [facility] - Partnering for exceptional care. Office: [phone]

## 2018-10-21 NOTE — Progress Notes (Signed)
CRITICAL CARE NOTE      CHIEF COMPLAINT:   68 year old female with acute cholecystitis & hydronephorsis due to nephrolithiasis s/p Lap chole with cystoscopy and R ureteral stent. Post op devt of fever with abd pain, Imaging via CT showed bowel perf and tiny R apical pneumo. Next taken to OR for exLap s/p sm bowel resec then to MICU for medical optimization.    SUBJECTIVE FINDINGS & SIGNIFICANT EVENTS   Patient succesfully liberated off mechanical ventilation and vasopressor support.  Now signficantly fluid overloaded on HHFNC.  Reviewed CXR this AM and spoke with IR , small pleural effusion with significant pulmonary edema and atelectasis. Will work on gentle diuresis and aggressive bronchopulmonary hygiene to recruit atelectatic segments.    PAST MEDICAL HISTORY   Past Medical History:  Diagnosis Date  . Hypertension      SURGICAL HISTORY   Past Surgical History:  Procedure Laterality Date  . BOWEL RESECTION N/A 10/18/2018   Procedure: SMALL BOWEL RESECTION;  Surgeon: Ancil Linseyavis, Jason Evan, MD;  Location: [facility];  Service: General;  Laterality: N/A;  . CHOLECYSTECTOMY N/A 10/15/2018   Procedure: LAPAROSCOPIC CHOLECYSTECTOMY with umbilical hernia repair;  Surgeon: Henrene DodgePiscoya, Jose, MD;  Location: [facility];  Service: General;  Laterality: N/A;  . CYSTOSCOPY WITH STENT PLACEMENT Right 10/15/2018   Procedure: CYSTOSCOPY WITH STENT PLACEMENT,right retrograde pyelogram;  Surgeon: Crist FatHerrick, Benjamin W, MD;  Location: [facility];  Service: Urology;  Laterality: Right;  . LAPAROTOMY N/A 10/18/2018   Procedure: EXPLORATORY LAPAROTOMY;  Surgeon: Ancil Linseyavis, Jason Evan, MD;  Location: [facility];  Service: General;  Laterality: N/A;     FAMILY HISTORY   History reviewed. No pertinent family history.   SOCIAL HISTORY   Social  History   Tobacco Use  . Smoking status: Never Smoker  . Smokeless tobacco: Never Used  Substance Use Topics  . Alcohol use: Not Currently  . Drug use: Never     MEDICATIONS   Current Medication:  Current Facility-Administered Medications:  .  0.9 %  sodium chloride infusion, , Intravenous, PRN, Henrene DodgePiscoya, Jose, MD, Stopped at 10/19/18 0149 .  Chlorhexidine Gluconate Cloth 2 % PADS 6 each, 6 each, Topical, Daily, Vida RiggerAleskerov, Brennon Otterness, MD, 6 each at 10/20/18 2117 .  escitalopram (LEXAPRO) tablet 10 mg, 10 mg, Oral, Daily, Bertram SavinSimpson, Michael L, RPH .  heparin injection 5,000 Units, 5,000 Units, Subcutaneous, Q8H, Blakeney, Dana G, NP, 5,000 Units at 10/21/18 0514 .  hydrocortisone sodium succinate (SOLU-CORTEF) 100 MG injection 100 mg, 100 mg, Intravenous, Q8H, Bertram SavinSimpson, Michael L, RPH, 100 mg at 10/21/18 0344 .  insulin aspart (novoLOG) injection 0-9 Units, 0-9 Units, Subcutaneous, Q4H, Bertram SavinSimpson, Michael L, RPH .  ipratropium-albuterol (DUONEB) 0.5-2.5 (3) MG/3ML nebulizer solution 3 mL, 3 mL, Nebulization, Q4H PRN, Harlon DittyKeene, Jeremiah D, NP .  MEDLINE mouth rinse, 15 mL, Mouth Rinse, BID, Harlon DittyKeene, Jeremiah D, NP, 15 mL at 10/20/18 2117 .  morphine 2 MG/ML injection 1-2 mg, 1-2 mg, Intravenous, Q4H PRN, Eugenie NorrieBlakeney, Dana G, NP .  norepinephrine (LEVOPHED) 16 mg in dextrose 5 % 250 mL (0.064 mg/mL) infusion, 0-40 mcg/min, Intravenous, Titrated, Eugenie NorrieBlakeney, Dana G, NP, Stopped at 10/20/18 1533 .  ondansetron (ZOFRAN-ODT) disintegrating tablet 4 mg, 4 mg, Oral, Q6H PRN **OR** ondansetron (ZOFRAN) injection 4 mg, 4 mg, Intravenous, Q6H PRN, Piscoya, Jose, MD, 4 mg at 10/18/18 1450 .  pantoprazole (PROTONIX) injection 40 mg, 40 mg, Intravenous, QHS, Piscoya, Jose, MD, 40 mg at 10/20/18 2117 .  piperacillin-tazobactam (ZOSYN) IVPB 3.375 g, 3.375 g, Intravenous,  Q8H, Piscoya, Jose, MD, Last Rate: 12.5 mL/hr at 10/21/18 0350, 3.375 g at 10/21/18 0350    ALLERGIES   Patient has no known allergies.    REVIEW OF  SYSTEMS     Unable to obtain due to sedation on mechanical ventilation.   PHYSICAL EXAMINATION   Vitals:   10/21/18 0550 10/21/18 0600  BP:  124/75  Pulse: 88 88  Resp: (!) 21 (!) 33  Temp:    SpO2: 95% 95%    GENERAL:NAD sedated RASS -1 HEAD: Normocephalic, atraumatic.  EYES: Pupils equal, round, reactive to light.  No scleral icterus.  MOUTH: Moist mucosal membrane. NECK: Supple. No thyromegaly. No nodules. No JVD.  PULMONARY: R pl effusion CARDIOVASCULAR: S1 and S2. Regular rate and rhythm. No murmurs, rubs, or gallops.  GASTROINTESTINAL: Soft, nontender, non-distended. No masses. Positive bowel sounds. No hepatosplenomegaly.  MUSCULOSKELETAL: No swelling, clubbing, or edema.  NEUROLOGIC: Mild distress due to acute illness SKIN:intact,warm,dry   LABS AND IMAGING       LAB RESULTS: Recent Labs  Lab 10/20/18 0318 10/20/18 1055 10/21/18 0400  NA 140 141 142  K 2.9* 3.3* 4.3  CL 109 111 109  CO2 23 23 22   BUN 32* 33* 39*  CREATININE 2.28* 2.20* 2.39*  GLUCOSE 135* 112* 111*   Recent Labs  Lab 10/19/18 0114 10/20/18 0318 10/21/18 0400  HGB 12.7 9.3* 8.9*  HCT 40.9 28.6* 28.1*  WBC 13.4* 13.8* 14.9*  PLT 398 375 335     IMAGING RESULTS: [place] Chest (pleural Effusion)  Result Date: 10/20/2018 CLINICAL DATA:  Pleural effusion. EXAM: CHEST ULTRASOUND COMPARISON:  Chest x-ray 10/20/2018. FINDINGS: Small right pleural effusion noted. Prominent amount of lung is noted within the access window. Thoracentesis not performed at this time. IMPRESSION: Small right pleural effusion. Thoracentesis not performed at this time. Electronically Signed   By: Marcello Moores  Register   On: 10/20/2018 13:50   Dg Chest Port 1 View  Result Date: 10/21/2018 CLINICAL DATA:  Acute respiratory failure. EXAM: PORTABLE CHEST 1 VIEW COMPARISON:  Chest x-ray from yesterday. FINDINGS: Interval extubation. Unchanged endotracheal tube. Stable cardiomediastinal silhouette. Hazy opacity throughout the  entire right hemithorax, worsened when compared to prior study, consistent with enlarging right-sided pleural effusion and worsening aeration of the right lung. Unchanged trace left pleural effusion with adjacent left basilar atelectasis. No pneumothorax. No acute osseous abnormality. IMPRESSION: 1. Increasing large right-sided pleural effusion with worsening aeration of the right lung. Electronically Signed   By: Titus Dubin M.D.   On: 10/21/2018 06:56      ASSESSMENT AND PLAN    -Multidisciplinary rounds held today  Acute Hypoxic Respiratory Failure -due to compressive atelectasis , pulmonary edema and small pleural effusion  - will work on diuresis and BPH today  -patient is 14Liters postive on fluid balance   S/p bowel resection POD3 -on levophed this for non fluid responsive circulatory shock -Cortisol level ordered, Solucortef 100 bid starting today - (cortisol level was drawn shortly after giving solucortef and is falsely elevated).   -oxygen as needed -follow up cardiac enzymes as indicated ICU monitoring    Acute Renal Failure Stage II-most likely due to ATN -worsening creatinine likely due to transient hypotension - now off of vasopressors will try gently diuresis 20mg  lasix  -follow chem 7 -follow UO -continue Foley Catheter-assess need daily   ID -continue IV abx as prescibed -follow up cultures  GI/Nutrition GI PROPHYLAXIS as indicated DIET-->TF's as tolerated Constipation protocol as indicated  ENDO - ICU  hypoglycemic\Hyperglycemia protocol -check FSBS per protocol   ELECTROLYTES -follow labs as needed -replace as needed -pharmacy consultation   DVT/GI PRX ordered -SCDs  TRANSFUSIONS AS NEEDED MONITOR FSBS ASSESS the need for LABS as needed   Critical care provider statement:    Critical care time (minutes):  32   Critical care time was exclusive of:  Separately billable procedures and treating other patients   Critical care was time  spent personally by me on the following activities:  Development of treatment plan with patient or surrogate, discussions with consultants, evaluation of patient's response to treatment, examination of patient, obtaining history from patient or surrogate, ordering and performing treatments and interventions, ordering and review of laboratory studies and re-evaluation of patient's condition.  I assumed direction of critical care for this patient from another provider in my specialty: no    This document was prepared using Dragon voice recognition software and may include unintentional dictation errors.    Vida RiggerFuad Kelleigh Skerritt, M.D.  Division of Pulmonary & Critical Care Medicine  [facility]

## 2018-10-21 NOTE — Progress Notes (Signed)
MetaNeb started, patient only able to tolerate for 4 minutes. Patient stated that it is hard to do after a few minutes. Instructed with cough and patient has weak non productive cough.

## 2018-10-21 NOTE — Progress Notes (Signed)
Pharmacy Electrolyte Monitoring Consult:  Pharmacy consulted to assist in monitoring and replacing electrolytes in this 68 y.o. female admitted on 10/15/2018. Patient s/p laparoscopic cholecystectomy and right ureteral stent placemetn on 6/27. Patient underwent exploratory laparotomy and small bowel resection on 7/1. Patient extubated on 7/2. Patient is off vasopressors.   Labs:  Sodium (mmol/L)  Date Value  10/21/2018 142   Potassium (mmol/L)  Date Value  10/21/2018 4.3   Magnesium (mg/dL)  Date Value  10/21/2018 3.0 (H)   Phosphorus (mg/dL)  Date Value  10/21/2018 4.6   Calcium (mg/dL)  Date Value  10/21/2018 8.2 (L)   Albumin (g/dL)  Date Value  10/21/2018 2.1 (L)    Assessment/Plan: Patient received furosemide 20mg  IV x 2 on 7/3.   Patient should receive oral replacement as tolerated.   Will replace for goal potassium ~ 4, goal magnesium ~ 2, and goal phosphorus ~ 2.5.   Will obtain electrolytes with am labs.   Pharmacy will continue to monitor and adjust per consult.   Simpson,Michael L 10/21/2018 4:28 PM

## 2018-10-21 NOTE — Progress Notes (Signed)
Nutrition Follow Up Note   DOCUMENTATION CODES:   Not applicable  INTERVENTION:   RD will monitor for diet advancement vs the need for nutrition support   Pt at high refeed risk   NUTRITION DIAGNOSIS:   Inadequate oral intake related to inability to eat as evidenced by NPO status.  GOAL:   Patient will meet greater than or equal to 90% of their needs  MONITOR:   Diet advancement, Labs, Weight trends, Skin, I & O's  ASSESSMENT:   68 year old female with PMHx of HTN who recently underwent laparoscopic cholecystectomy and right ureteral stent placement on 6/27 found to have small bowel perforation s/p exploratory laparotomy with extensive lysis of adhesions and small bowel resection (70 cm of mid to distal ileum but terminal ileum remains intact) on 7/1 and patient transferred to ICU on ventilator post-operatively with concern for developing sepsis and large right pleural effusion.   Pt extubated 7/2. Pt remains NPO; NGT in place with 132ml output. Per MD note, may be able to clamp NGT and start clear liquids soon. RD will monitor for diet advancement vs the need for nutrition support. Pt is at high refeed risk. There are no new weights since admit; will request daily weights.   Medications reviewed and include: heparin, insulin, protonix, zosyn   Labs reviewed: K 4.3 wnl, P 4.6 wnl, Mg 3.0(H) Wbc- 14.9(H), Hgb 8.9(L), Hct 28.1(L)  Diet Order:   Diet Order            Diet NPO time specified  Diet effective now             EDUCATION NEEDS:   No education needs have been identified at this time  Skin:  Skin Assessment: Skin Integrity Issues:(closed incision to abdomen)  Last BM:  10/15/2018 per chart  Height:   Ht Readings from Last 1 Encounters:  10/15/18 5\' 4"  (1.626 m)   Weight:   Wt Readings from Last 1 Encounters:  10/15/18 68.9 kg   Ideal Body Weight:  54.5 kg  BMI:  Body mass index is 26.09 kg/m.  Estimated Nutritional Needs:   Kcal:   1500-1700kcal/day  Protein:  75-85g/day  Fluid:  1.6L/day  Koleen Distance MS, RD, LDN Pager #- [phone] Office#- [phone] After Hours Pager: [phone]

## 2018-10-21 NOTE — Progress Notes (Signed)
Seen and examined Did well after NGT was removed. Taking some ice chips AVSS Good UO   PE NAD Abd: dressing removed, staples in place and incision healing well, no infection, no peritonitis  A/P Slowly improving Appreciate critical care team's assistance pulm toilet and optimization

## 2018-10-22 ENCOUNTER — Inpatient Hospital Stay (HOSPITAL_COMMUNITY)
Admit: 2018-10-22 | Discharge: 2018-10-22 | Disposition: A | Discharge status: Home / Self Care | Payer: Medicare HMO | Attending: Critical Care Medicine | Admitting: Critical Care Medicine

## 2018-10-22 ENCOUNTER — Inpatient Hospital Stay: Payer: Medicare HMO

## 2018-10-22 DIAGNOSIS — J80 Acute respiratory distress syndrome: Secondary | ICD-10-CM

## 2018-10-22 LAB — BASIC METABOLIC PANEL
Anion gap: 10 (ref 5–15)
BUN: 49 mg/dL — ABNORMAL HIGH (ref 8–23)
CO2: 27 mmol/L (ref 22–32)
Calcium: 8.4 mg/dL — ABNORMAL LOW (ref 8.9–10.3)
Chloride: 105 mmol/L (ref 98–111)
Creatinine, Ser: 2.53 mg/dL — ABNORMAL HIGH (ref 0.44–1.00)
GFR calc Af Amer: 22 mL/min — ABNORMAL LOW (ref >60)
GFR calc non Af Amer: 19 mL/min — ABNORMAL LOW (ref >60)
Glucose, Bld: 95 mg/dL (ref 70–99)
Potassium: 2.8 mmol/L — ABNORMAL LOW (ref 3.5–5.1)
Sodium: 142 mmol/L (ref 135–145)

## 2018-10-22 LAB — ECHOCARDIOGRAM COMPLETE
Height: 64 in
Weight: 3033.53 oz

## 2018-10-22 LAB — CBC WITH DIFFERENTIAL/PLATELET
Abs Immature Granulocytes: 0.5 10*3/uL — ABNORMAL HIGH (ref 0.00–0.07)
Basophils Absolute: 0 10*3/uL (ref 0.0–0.1)
Basophils Relative: 0 %
Eosinophils Absolute: 0.1 10*3/uL (ref 0.0–0.5)
Eosinophils Relative: 1 %
HCT: 25.1 % — ABNORMAL LOW (ref 36.0–46.0)
Hemoglobin: 8.1 g/dL — ABNORMAL LOW (ref 12.0–15.0)
Immature Granulocytes: 4 %
Lymphocytes Relative: 10 %
Lymphs Abs: 1.2 10*3/uL (ref 0.7–4.0)
MCH: 28.2 pg (ref 26.0–34.0)
MCHC: 32.3 g/dL (ref 30.0–36.0)
MCV: 87.5 fL (ref 80.0–100.0)
Monocytes Absolute: 1.1 10*3/uL — ABNORMAL HIGH (ref 0.1–1.0)
Monocytes Relative: 9 %
Neutro Abs: 9.1 10*3/uL — ABNORMAL HIGH (ref 1.7–7.7)
Neutrophils Relative %: 76 %
Platelets: 292 10*3/uL (ref 150–400)
RBC: 2.87 MIL/uL — ABNORMAL LOW (ref 3.87–5.11)
RDW: 14 % (ref 11.5–15.5)
WBC: 12 10*3/uL — ABNORMAL HIGH (ref 4.0–10.5)
nRBC: 0 % (ref 0.0–0.2)

## 2018-10-22 LAB — MAGNESIUM: Magnesium: 2.5 mg/dL — ABNORMAL HIGH (ref 1.7–2.4)

## 2018-10-22 LAB — GLUCOSE, CAPILLARY
Glucose-Capillary: 84 mg/dL (ref 70–99)
Glucose-Capillary: 88 mg/dL (ref 70–99)
Glucose-Capillary: 90 mg/dL (ref 70–99)

## 2018-10-22 LAB — PHOSPHORUS: Phosphorus: 3 mg/dL (ref 2.5–4.6)

## 2018-10-22 MED ORDER — POTASSIUM CHLORIDE 10 MEQ/50ML IV SOLN
10.0000 meq | INTRAVENOUS | Status: AC
  Administered 2018-10-22 (×5): 10 meq via INTRAVENOUS
  Filled 2018-10-22 (×5): qty 50

## 2018-10-22 NOTE — Progress Notes (Signed)
Subjective:  CC: Holly SkeetersSandra Dickson is a 68 y.o. female  Hospital stay day 5, 4 Days Post-Op lap chole, subsequent ex-lap, SB resection  HPI: No new issues overnight.  Multiple BMs, feeling ok.  ROS:  General: Denies weight loss, weight gain, fatigue, fevers, chills, and night sweats. Heart: Denies chest pain, palpitations, racing heart, irregular heartbeat, leg pain or swelling, and decreased activity tolerance. Respiratory: Denies breathing difficulty, shortness of breath, wheezing, cough, and sputum. GI: Denies change in appetite, heartburn, nausea, vomiting, constipation, diarrhea, and blood in stool. GU: Denies difficulty urinating, pain with urinating, urgency, frequency, blood in urine.   Objective:   Temp:  [98.1 F (36.7 C)-99.7 F (37.6 C)] 98.9 F (37.2 C) (07/04 0800) Pulse Rate:  [72-96] 74 (07/04 1100) Resp:  [13-23] 15 (07/04 1100) BP: (107-146)/(66-87) 119/68 (07/04 1100) SpO2:  [91 %-100 %] 98 % (07/04 1100) FiO2 (%):  [40 %-51 %] 43 % (07/04 0833) Weight:  [86 kg] 86 kg (07/04 0331)     Height: 5\' 4"  (162.6 cm) Weight: 86 kg BMI (Calculated): 32.53   Intake/Output this shift:   Intake/Output Summary (Last 24 hours) at 10/22/2018 1112 Last data filed at 10/22/2018 [id number] Gross per 24 hour  Intake 676.62 ml  Output 3080 ml  Net -2403.38 ml    Constitutional :  alert, cooperative, appears stated age and no distress  Respiratory:  clear to auscultation bilaterally  Cardiovascular:  regular rate and rhythm  Gastrointestinal: soft, non-tender; bowel sounds normal; no masses,  no organomegaly.   Skin: Cool and moist. Staples intact, JP with serosanguinous fluid  Psychiatric: Normal affect, non-agitated, not confused       LABS:  CMP Latest Ref Rng & Units 10/22/2018 10/21/2018 10/20/2018  Glucose 70 - 99 mg/dL 95 960(A111(H) 540(J112(H)  BUN 8 - 23 mg/dL 81(X49(H) 91(Y39(H) 78(G33(H)  Creatinine 0.44 - 1.00 mg/dL 9.56(O2.53(H) 1.30(Q2.39(H) 6.57(Q2.20(H)  Sodium 135 - 145 mmol/L 142 142 141  Potassium 3.5 -  5.1 mmol/L 2.8(L) 4.3 3.3(L)  Chloride 98 - 111 mmol/L 105 109 111  CO2 22 - 32 mmol/L 27 22 23   Calcium 8.9 - 10.3 mg/dL 4.6(N8.4(L) 6.2(X8.2(L) 7.3(L)  Total Protein 6.5 - 8.1 g/dL - - -  Total Bilirubin 0.3 - 1.2 mg/dL - - -  Alkaline Phos 38 - 126 U/L - - -  AST 15 - 41 U/L - - -  ALT 0 - 44 U/L - - -   CBC Latest Ref Rng & Units 10/22/2018 10/21/2018 10/20/2018  WBC 4.0 - 10.5 K/uL 12.0(H) 14.9(H) 13.8(H)  Hemoglobin 12.0 - 15.0 g/dL 8.1(L) 8.9(L) 9.3(L)  Hematocrit 36.0 - 46.0 % 25.1(L) 28.1(L) 28.6(L)  Platelets 150 - 400 K/uL 292 335 375    RADS: CLINICAL DATA:  Acute respiratory failure.  EXAM: PORTABLE CHEST 1 VIEW  COMPARISON:  10/21/2018  FINDINGS: Orogastric or nasogastric tube is been removed. Persistent cardiomegaly. Persistent large right effusion with atelectasis of the right lung. Effusion may be larger. Slightly worsened atelectasis at the left base.  IMPRESSION: Large right effusion with right lung atelectasis, possibly worsened since yesterday. Worsening atelectasis at the left lung base.   Electronically Signed   By: Paulina FusiMark  Shogry M.D.   On: 10/22/2018 08:16  Assessment:   S/p  lap chole, subsequent ex-lap, SB resection.  Abdominal exam and hx of BMs, reassuring.  Will advance to regular diet.  Discussed case with critical care team.  Pleural effusion noted on CXR is not as extensive on US exam, no  plans for any drainage at this point, and will continue to diuresis and monitor.  Appreciate input and care from respiratory standpoint.  Increasing Cr level maybe be due to AKI from pressor use, possible persistently retained stone noted on initial admission.  Will see if any report of actual visualization, passing of stone prior to considering additional imaging studies to reassess if there is continued obstruction.  Pt currently is not complaining of any flank pain, so that is reassuring.  Continue to monitor.  Spoke with husband extensively about  pathophysiology of acute cholecystitis, bowel perforation, and nephrolithiasis.  All questions answered at this time.  He verbalized understanding and agrees with current plan.  Will continue to update as needed.

## 2018-10-22 NOTE — Progress Notes (Signed)
*  PRELIMINARY RESULTS* Echocardiogram 2D Echocardiogram has been performed.  Rob Mciver S Riaan Toledo 10/22/2018, 12:21 PM 

## 2018-10-22 NOTE — Progress Notes (Signed)
Patient is refusing to wear the HFNC, stating that "something is wrong with the oxygen" and that she does not need it because she "is breathing just fine." HFNC checked and patient reassured that nothing is wrong and that the machine is working properly.  Patient continues to refuse to wear the HFNC after education. Magdalene, NP, spoke with patient, and patient agreed to put it back on if her oxygen drops below 88%. Will continue to monitor.  Cameron Ali, RN

## 2018-10-22 NOTE — Progress Notes (Signed)
CRITICAL CARE NOTE      CHIEF COMPLAINT:   68 year old female with acute cholecystitis & hydronephorsis due to nephrolithiasis s/p Lap chole with cystoscopy and R ureteral stent. Post op devt of fever with abd pain, Imaging via CT showed bowel perf and tiny R apical pneumo. Next taken to OR for exLap s/p sm bowel resec then to MICU for medical optimization.    SUBJECTIVE FINDINGS & SIGNIFICANT EVENTS   Patient succesfully liberated off mechanical ventilation and vasopressor support.  Was signficantly fluid overloaded on HHFNC.  Reviewed CXR this AM and spoke with IR , small pleural effusion with significant pulmonary edema and atelectasis. Will work on gentle diuresis and aggressive bronchopulmonary hygiene to recruit atelectatic segments.    PAST MEDICAL HISTORY   Past Medical History:  Diagnosis Date  . Hypertension      SURGICAL HISTORY   Past Surgical History:  Procedure Laterality Date  . BOWEL RESECTION N/A 10/18/2018   Procedure: SMALL BOWEL RESECTION;  Surgeon: Ancil Linseyavis, Jason Evan, MD;  Location: [facility];  Service: General;  Laterality: N/A;  . CHOLECYSTECTOMY N/A 10/15/2018   Procedure: LAPAROSCOPIC CHOLECYSTECTOMY with umbilical hernia repair;  Surgeon: Henrene DodgePiscoya, Jose, MD;  Location: [facility];  Service: General;  Laterality: N/A;  . CYSTOSCOPY WITH STENT PLACEMENT Right 10/15/2018   Procedure: CYSTOSCOPY WITH STENT PLACEMENT,right retrograde pyelogram;  Surgeon: Crist FatHerrick, Benjamin W, MD;  Location: [facility];  Service: Urology;  Laterality: Right;  . LAPAROTOMY N/A 10/18/2018   Procedure: EXPLORATORY LAPAROTOMY;  Surgeon: Ancil Linseyavis, Jason Evan, MD;  Location: [facility];  Service: General;  Laterality: N/A;     FAMILY HISTORY   History reviewed. No pertinent family history.   SOCIAL HISTORY   Social  History   Tobacco Use  . Smoking status: Never Smoker  . Smokeless tobacco: Never Used  Substance Use Topics  . Alcohol use: Not Currently  . Drug use: Never     MEDICATIONS   Current Medication:  Current Facility-Administered Medications:  .  0.9 %  sodium chloride infusion, , Intravenous, PRN, Henrene DodgePiscoya, Jose, MD, Stopped at 10/19/18 0149 .  Chlorhexidine Gluconate Cloth 2 % PADS 6 each, 6 each, Topical, Daily, Vida RiggerAleskerov, Kalyani Maeda, MD, 6 each at 10/20/18 2117 .  escitalopram (LEXAPRO) tablet 10 mg, 10 mg, Oral, Daily, Bertram SavinSimpson, Michael L, RPH .  heparin injection 5,000 Units, 5,000 Units, Subcutaneous, Q8H, Eugenie NorrieBlakeney, Dana G, NP, 5,000 Units at 10/22/18 0526 .  insulin aspart (novoLOG) injection 0-9 Units, 0-9 Units, Subcutaneous, Q4H, Bertram SavinSimpson, Michael L, RPH, 2 Units at 10/21/18 2328 .  ipratropium-albuterol (DUONEB) 0.5-2.5 (3) MG/3ML nebulizer solution 3 mL, 3 mL, Nebulization, Q4H PRN, Harlon DittyKeene, Jeremiah D, NP, 3 mL at 10/22/18 0024 .  MEDLINE mouth rinse, 15 mL, Mouth Rinse, BID, Harlon DittyKeene, Jeremiah D, NP, 15 mL at 10/21/18 1045 .  morphine 2 MG/ML injection 1-2 mg, 1-2 mg, Intravenous, Q4H PRN, Eugenie NorrieBlakeney, Dana G, NP, 2 mg at 10/21/18 0748 .  ondansetron (ZOFRAN-ODT) disintegrating tablet 4 mg, 4 mg, Oral, Q6H PRN **OR** ondansetron (ZOFRAN) injection 4 mg, 4 mg, Intravenous, Q6H PRN, Piscoya, Jose, MD, 4 mg at 10/18/18 1450 .  pantoprazole (PROTONIX) injection 40 mg, 40 mg, Intravenous, QHS, Piscoya, Jose, MD, 40 mg at 10/21/18 2143 .  piperacillin-tazobactam (ZOSYN) IVPB 3.375 g, 3.375 g, Intravenous, Q8H, Piscoya, Jose, MD, Last Rate: 12.5 mL/hr at 10/22/18 0325, 3.375 g at 10/22/18 0325 .  potassium chloride 10 mEq in 50 mL *CENTRAL LINE* IVPB, 10 mEq, Intravenous, Q1 Hr x 5, Piscoya,  Jose, MD, Last Rate: 50 mL/hr at 10/22/18 0605, 10 mEq at 10/22/18 [id number]    ALLERGIES   Patient has no known allergies.    REVIEW OF SYSTEMS     Unable to obtain due to sedation on mechanical  ventilation.   PHYSICAL EXAMINATION   Vitals:   10/22/18 0500 10/22/18 0600  BP: 136/80 127/72  Pulse: 78 72  Resp: 20 20  Temp:    SpO2: 96% 100%    GENERAL:NAD sedated RASS -1 HEAD: Normocephalic, atraumatic.  EYES: Pupils equal, round, reactive to light.  No scleral icterus.  MOUTH: Moist mucosal membrane. NECK: Supple. No thyromegaly. No nodules. No JVD.  PULMONARY: R pl effusion CARDIOVASCULAR: S1 and S2. Regular rate and rhythm. No murmurs, rubs, or gallops.  GASTROINTESTINAL: Soft, nontender, non-distended. No masses. Positive bowel sounds. No hepatosplenomegaly.  MUSCULOSKELETAL: No swelling, clubbing, or edema.  NEUROLOGIC: Mild distress due to acute illness SKIN:intact,warm,dry   LABS AND IMAGING       LAB RESULTS: Recent Labs  Lab 10/20/18 1055 10/21/18 0400 10/22/18 0443  NA 141 142 142  K 3.3* 4.3 2.8*  CL 111 109 105  CO2 23 22 27   BUN 33* 39* 49*  CREATININE 2.20* 2.39* 2.53*  GLUCOSE 112* 111* 95   Recent Labs  Lab 10/20/18 0318 10/21/18 0400 10/22/18 0443  HGB 9.3* 8.9* 8.1*  HCT 28.6* 28.1* 25.1*  WBC 13.8* 14.9* 12.0*  PLT 375 335 292     IMAGING RESULTS: [place] Chest (pleural Effusion)  Result Date: 10/21/2018 CLINICAL DATA:  Evaluate pleural effusion. EXAM: CHEST ULTRASOUND COMPARISON:  Chest x-ray 10/21/2018 FINDINGS: A moderate-sized right pleural effusion is demonstrated by ultrasound. Overlying atelectatic lung is noted. IMPRESSION: Moderate-sized right pleural effusion. Electronically Signed   By: Rudie MeyerP.  Gallerani M.D.   On: 10/21/2018 11:16   Dg Chest Port 1 View  Result Date: 10/21/2018 CLINICAL DATA:  Recheck pleural effusion. EXAM: PORTABLE CHEST 1 VIEW COMPARISON:  Chest x-ray from same day at 4:33 a.m. Chest x-ray from yesterday. FINDINGS: Unchanged endotracheal tube. Stable cardiomediastinal silhouette. Hazy opacity throughout the entire right hemithorax seen on chest x-ray from earlier this morning reflects layering moderate  pleural effusion on this upright exam, similar in appearance to chest x-ray from yesterday. Unchanged atelectasis at the right lung base. Improved aeration at the left lung base. No pneumothorax. No acute osseous abnormality. IMPRESSION: 1. Moderate right pleural effusion, unchanged from yesterday. Opacity throughout the entire right hemithorax seen on chest x-ray earlier this morning reflects layering of the pleural effusion in the supine position. Electronically Signed   By: Obie DredgeWilliam T Derry M.D.   On: 10/21/2018 10:33      ASSESSMENT AND PLAN    -Multidisciplinary rounds held today  Acute Hypoxic Respiratory Failure -due to compressive atelectasis , pulmonary edema and small pleural effusion  - will work on diuresis and BPH today  -patient is 14Liters postive on fluid balance -continue with diuresis  S/p bowel resection POD3 -on levophed this for non fluid responsive circulatory shock -Cortisol level ordered, Solucortef 100 bid starting today - (cortisol level was drawn shortly after giving solucortef and is falsely elevated).   -oxygen as needed -follow up cardiac enzymes as indicated ICU monitoring    Acute Renal Failure Stage II-most likely due to ATN -worsening creatinine likely due to transient hypotension - now off of vasopressors will try gently diuresis 20mg  lasix  -follow chem 7 -follow UO -continue Foley Catheter-assess need daily   ID -continue IV abx as  prescibed -follow up cultures  GI/Nutrition GI PROPHYLAXIS as indicated DIET-->TF's as tolerated Constipation protocol as indicated  ENDO - ICU hypoglycemic\Hyperglycemia protocol -check FSBS per protocol   ELECTROLYTES -follow labs as needed -replace as needed -pharmacy consultation   DVT/GI PRX ordered -SCDs  TRANSFUSIONS AS NEEDED MONITOR FSBS ASSESS the need for LABS as needed   Critical care provider statement:    Critical care time (minutes):  32   Critical care time was exclusive of:   Separately billable procedures and treating other patients   Critical care was time spent personally by me on the following activities:  Development of treatment plan with patient or surrogate, discussions with consultants, evaluation of patient's response to treatment, examination of patient, obtaining history from patient or surrogate, ordering and performing treatments and interventions, ordering and review of laboratory studies and re-evaluation of patient's condition.  I assumed direction of critical care for this patient from another provider in my specialty: no    This document was prepared using Dragon voice recognition software and may include unintentional dictation errors.    Ottie Glazier, M.D.  Division of [place]

## 2018-10-22 NOTE — Progress Notes (Signed)
Pharmacy Electrolyte Monitoring Consult:  Pharmacy consulted to assist in monitoring and replacing electrolytes in this 68 y.o. female admitted on 10/15/2018. Patient s/p laparoscopic cholecystectomy and right ureteral stent placemetn on 6/27. Patient underwent exploratory laparotomy and small bowel resection on 7/1. Patient extubated on 7/2. Patient is off vasopressors.   Labs:  Sodium (mmol/L)  Date Value  10/22/2018 142   Potassium (mmol/L)  Date Value  10/22/2018 2.8 (L)   Magnesium (mg/dL)  Date Value  10/22/2018 2.5 (H)   Phosphorus (mg/dL)  Date Value  10/22/2018 3.0   Calcium (mg/dL)  Date Value  10/22/2018 8.4 (L)   Albumin (g/dL)  Date Value  10/21/2018 2.1 (L)    Assessment/Plan: 07/04 @ 0500 K 2.8 will replace w/ KCl 10 mEq IV x 5 and will recheck electrolytes w/ am labs.  Will replace for goal potassium ~ 4, goal magnesium ~ 2, and goal phosphorus ~ 2.5.   Will obtain electrolytes with am labs.   Pharmacy will continue to monitor and adjust per consult.   Tobie Lords, PharmD, BCPS Clinical Pharmacist 10/22/2018 5:50 AM

## 2018-10-22 NOTE — Progress Notes (Signed)
Pt transferred to RM 203 at this time. Pt on HFNC. Alert and oriented. No complaints at this time. VSS prior to transfer.

## 2018-10-23 ENCOUNTER — Inpatient Hospital Stay: Payer: Medicare HMO

## 2018-10-23 LAB — BASIC METABOLIC PANEL
Anion gap: 8 (ref 5–15)
BUN: 42 mg/dL — ABNORMAL HIGH (ref 8–23)
CO2: 26 mmol/L (ref 22–32)
Calcium: 8.4 mg/dL — ABNORMAL LOW (ref 8.9–10.3)
Chloride: 110 mmol/L (ref 98–111)
Creatinine, Ser: 2.27 mg/dL — ABNORMAL HIGH (ref 0.44–1.00)
GFR calc Af Amer: 25 mL/min — ABNORMAL LOW (ref >60)
GFR calc non Af Amer: 22 mL/min — ABNORMAL LOW (ref >60)
Glucose, Bld: 118 mg/dL — ABNORMAL HIGH (ref 70–99)
Potassium: 3.1 mmol/L — ABNORMAL LOW (ref 3.5–5.1)
Sodium: 144 mmol/L (ref 135–145)

## 2018-10-23 LAB — CBC WITH DIFFERENTIAL/PLATELET
Abs Immature Granulocytes: 0.79 10*3/uL — ABNORMAL HIGH (ref 0.00–0.07)
Basophils Absolute: 0.1 10*3/uL (ref 0.0–0.1)
Basophils Relative: 1 %
Eosinophils Absolute: 0.1 10*3/uL (ref 0.0–0.5)
Eosinophils Relative: 1 %
HCT: 32.4 % — ABNORMAL LOW (ref 36.0–46.0)
Hemoglobin: 10.3 g/dL — ABNORMAL LOW (ref 12.0–15.0)
Immature Granulocytes: 4 %
Lymphocytes Relative: 7 %
Lymphs Abs: 1.3 10*3/uL (ref 0.7–4.0)
MCH: 28.1 pg (ref 26.0–34.0)
MCHC: 31.8 g/dL (ref 30.0–36.0)
MCV: 88.5 fL (ref 80.0–100.0)
Monocytes Absolute: 1.2 10*3/uL — ABNORMAL HIGH (ref 0.1–1.0)
Monocytes Relative: 6 %
Neutro Abs: 16 10*3/uL — ABNORMAL HIGH (ref 1.7–7.7)
Neutrophils Relative %: 81 %
Platelets: 310 10*3/uL (ref 150–400)
RBC: 3.66 MIL/uL — ABNORMAL LOW (ref 3.87–5.11)
RDW: 14.2 % (ref 11.5–15.5)
WBC: 19.6 10*3/uL — ABNORMAL HIGH (ref 4.0–10.5)
nRBC: 0 % (ref 0.0–0.2)

## 2018-10-23 LAB — MAGNESIUM: Magnesium: 2.2 mg/dL (ref 1.7–2.4)

## 2018-10-23 LAB — CULTURE, RESPIRATORY W GRAM STAIN: Culture: NO GROWTH

## 2018-10-23 LAB — C DIFFICILE QUICK SCREEN W PCR REFLEX
C Diff antigen: NEGATIVE
C Diff interpretation: NOT DETECTED
C Diff toxin: NEGATIVE

## 2018-10-23 MED ORDER — SODIUM CHLORIDE 0.9 % IV SOLN
INTRAVENOUS | Status: DC | PRN
  Administered 2018-10-23: 250 mL via INTRAVENOUS
  Administered 2018-10-24: 15 mL via INTRAVENOUS
  Administered 2018-10-24: 1 mL via INTRAVENOUS
  Administered 2018-10-26: 250 mL via INTRAVENOUS

## 2018-10-23 MED ORDER — POTASSIUM CHLORIDE 10 MEQ/100ML IV SOLN
10.0000 meq | INTRAVENOUS | Status: DC
  Administered 2018-10-23: 10 meq via INTRAVENOUS
  Filled 2018-10-23: qty 100

## 2018-10-23 MED ORDER — POTASSIUM CHLORIDE 20 MEQ PO PACK
20.0000 meq | PACK | ORAL | Status: AC
  Administered 2018-10-23: 20 meq via ORAL
  Filled 2018-10-23 (×2): qty 1

## 2018-10-23 MED ORDER — METOCLOPRAMIDE HCL 5 MG/ML IJ SOLN
5.0000 mg | Freq: Four times a day (QID) | INTRAMUSCULAR | Status: DC | PRN
  Administered 2018-10-23: 5 mg via INTRAVENOUS
  Filled 2018-10-23: qty 2

## 2018-10-23 MED ORDER — LACTATED RINGERS IV SOLN
INTRAVENOUS | Status: DC
  Administered 2018-10-23 – 2018-10-25 (×4): via INTRAVENOUS

## 2018-10-23 NOTE — Progress Notes (Signed)
Assigned to pt from 1621-1900.  Reglan given for nausea with improvement.  Denies pain. VSS. C/o feeling very weak. Dr Lysle Pearl made aware.  No new orders.

## 2018-10-23 NOTE — Progress Notes (Signed)
Returned call to [employer]. VM not set up and no answer. Will try again later.

## 2018-10-23 NOTE — Progress Notes (Addendum)
Subjective:  CC: Holly Dickson is a 68 y.o. female  Hospital stay day 6, 5 Days Post-Op lap chole, subsequent ex-lap, SB resection  HPI: No new issues overnight.  Multiple loose, small BMs, tolerated diet.  ROS:  General: Denies weight loss, weight gain, fatigue, fevers, chills, and night sweats. Heart: Denies chest pain, palpitations, racing heart, irregular heartbeat, leg pain or swelling, and decreased activity tolerance. Respiratory: Denies breathing difficulty, shortness of breath, wheezing, cough, and sputum. GI: Denies change in appetite, heartburn, nausea, vomiting, constipation, and blood in stool. GU: Denies difficulty urinating, pain with urinating, urgency, frequency, blood in urine.   Objective:   Temp:  [97.8 F (36.6 C)-99 F (37.2 C)] 99 F (37.2 C) (07/05 0456) Pulse Rate:  [70-96] 96 (07/05 0816) Resp:  [15-22] 22 (07/05 0816) BP: (119-146)/(68-82) 137/81 (07/05 0456) SpO2:  [92 %-99 %] 92 % (07/05 0816) Weight:  [84.9 kg] 84.9 kg (07/05 0402)     Height: 5\' 4"  (162.6 cm) Weight: 84.9 kg BMI (Calculated): 32.11   Intake/Output this shift:   Intake/Output Summary (Last 24 hours) at 10/23/2018 1029 Last data filed at 10/23/2018 0536 Gross per 24 hour  Intake 403.68 ml  Output 2375 ml  Net -1971.32 ml    Constitutional :  alert, cooperative, appears stated age and no distress  Respiratory:  clear to auscultation bilaterally, on regular [place]  Cardiovascular:  regular rate and rhythm  Gastrointestinal: soft, non-tender; bowel sounds normal; no masses,  no organomegaly. Non-distended  Skin: Cool and moist. Staples intact, JP with serosanguinous fluid  Psychiatric: Normal affect, non-agitated, not confused       LABS:  CMP Latest Ref Rng & Units 10/23/2018 10/22/2018 10/21/2018  Glucose 70 - 99 mg/dL 118(H) 95 111(H)  BUN 8 - 23 mg/dL 42(H) 49(H) 39(H)  Creatinine 0.44 - 1.00 mg/dL 2.27(H) 2.53(H) 2.39(H)  Sodium 135 - 145 mmol/L 144 142 142  Potassium 3.5 - 5.1  mmol/L 3.1(L) 2.8(L) 4.3  Chloride 98 - 111 mmol/L 110 105 109  CO2 22 - 32 mmol/L 26 27 22   Calcium 8.9 - 10.3 mg/dL 8.4(L) 8.4(L) 8.2(L)  Total Protein 6.5 - 8.1 g/dL - - -  Total Bilirubin 0.3 - 1.2 mg/dL - - -  Alkaline Phos 38 - 126 U/L - - -  AST 15 - 41 U/L - - -  ALT 0 - 44 U/L - - -   CBC Latest Ref Rng & Units 10/22/2018 10/21/2018 10/20/2018  WBC 4.0 - 10.5 K/uL 12.0(H) 14.9(H) 13.8(H)  Hemoglobin 12.0 - 15.0 g/dL 8.1(L) 8.9(L) 9.3(L)  Hematocrit 36.0 - 46.0 % 25.1(L) 28.1(L) 28.6(L)  Platelets 150 - 400 K/uL 292 335 375    RADS:   Assessment:   S/p  lap chole, subsequent ex-lap, SB resection.   Will check for c.diff, due to increased stooling and rising leukocytosis.  Continue regular diet for now.  Cr level is decreasing.  Continue to monitor  Oxygen requirements dropping, on regular  now.  Cont to monitor I personally removed the femoral central line at bedside by removing dressing, cutting sutures and pulling out entire catheter and holding pressure for over 48min.  No signs of active bleeding noted afterwards, area dressed with 4x4 pressure dressing.  Pt. Tolerated procedure well.  PT eval and treat

## 2018-10-23 NOTE — Progress Notes (Signed)
Pt had loose dark stool with blood. Pink urine within foley.  Foley removed. Notified Dr Lysle Pearl via text massage.   Notified Dr Lysle Pearl of another small brown/dark red stool. Per MD to d/c heparin.

## 2018-10-23 NOTE — Progress Notes (Signed)
Pharmacy Electrolyte Monitoring Consult:  Pharmacy consulted to assist in monitoring and replacing electrolytes in this 68 y.o. female admitted on 10/15/2018. Patient s/p Lap chole with cystoscopy and R ureteral stent. Post op devt of fever with abd pain, Imaging via CT showed bowel perf and tiny R apical pneumo. Next taken to OR for exLap s/p sm bowel resec then to MICU for medical optimization.   Labs:  Sodium (mmol/L)  Date Value  10/23/2018 144   Potassium (mmol/L)  Date Value  10/23/2018 3.1 (L)   Magnesium (mg/dL)  Date Value  10/23/2018 2.2   Phosphorus (mg/dL)  Date Value  10/22/2018 3.0   Calcium (mg/dL)  Date Value  10/23/2018 8.4 (L)   Albumin (g/dL)  Date Value  10/21/2018 2.1 (L)    Assessment/Plan:  replace w/ KCl 10 mEq IV x 4   Goal electrolytes wnl  Will obtain electrolytes with am labs.   Pharmacy will continue to monitor and adjust per consult.   Vallery Sa, PharmD Clinical Pharmacist 10/23/2018 10:29 AM

## 2018-10-23 NOTE — Progress Notes (Signed)
Patient did not tolerate CPT very well. Patient complaining of feeling sick/nauseous. Patient unable to maintain proper seal/compliance for CPT.

## 2018-10-23 NOTE — Progress Notes (Signed)
PT Cancellation Note  Patient Details Name: Holly Dickson MRN: [id number] DOB: 10/28/1950   Cancelled Treatment:    Reason Eval/Treat Not Completed: Medical issues which prohibited therapy(Chart reviewed, arrived to see patient, but pt at EOB with emesis bag on face, NA at bedside, pt not doing well. Will attempt evaluation again at later date/time.)  4:16 PM, 10/23/18 Etta Grandchild, PT, DPT Physical Therapist - [facility]  [phone] ([place])    Buccola,Allan C 10/23/2018, 4:16 PM

## 2018-10-24 ENCOUNTER — Inpatient Hospital Stay: Payer: Medicare HMO

## 2018-10-24 ENCOUNTER — Encounter: Payer: Self-pay | Admitting: Surgery

## 2018-10-24 LAB — CULTURE, BLOOD (ROUTINE X 2)
Culture: NO GROWTH
Culture: NO GROWTH
Special Requests: ADEQUATE

## 2018-10-24 LAB — BASIC METABOLIC PANEL
Anion gap: 10 (ref 5–15)
BUN: 33 mg/dL — ABNORMAL HIGH (ref 8–23)
CO2: 27 mmol/L (ref 22–32)
Calcium: 8.3 mg/dL — ABNORMAL LOW (ref 8.9–10.3)
Chloride: 106 mmol/L (ref 98–111)
Creatinine, Ser: 1.92 mg/dL — ABNORMAL HIGH (ref 0.44–1.00)
GFR calc Af Amer: 31 mL/min — ABNORMAL LOW (ref >60)
GFR calc non Af Amer: 27 mL/min — ABNORMAL LOW (ref >60)
Glucose, Bld: 113 mg/dL — ABNORMAL HIGH (ref 70–99)
Potassium: 3 mmol/L — ABNORMAL LOW (ref 3.5–5.1)
Sodium: 143 mmol/L (ref 135–145)

## 2018-10-24 LAB — PROTIME-INR
INR: 1.4 — ABNORMAL HIGH (ref 0.8–1.2)
Prothrombin Time: 16.7 seconds — ABNORMAL HIGH (ref 11.4–15.2)

## 2018-10-24 LAB — CBC WITH DIFFERENTIAL/PLATELET
Abs Immature Granulocytes: 1.07 10*3/uL — ABNORMAL HIGH (ref 0.00–0.07)
Basophils Absolute: 0.1 10*3/uL (ref 0.0–0.1)
Basophils Relative: 0 %
Eosinophils Absolute: 0 10*3/uL (ref 0.0–0.5)
Eosinophils Relative: 0 %
HCT: 30.4 % — ABNORMAL LOW (ref 36.0–46.0)
Hemoglobin: 9.6 g/dL — ABNORMAL LOW (ref 12.0–15.0)
Immature Granulocytes: 5 %
Lymphocytes Relative: 6 %
Lymphs Abs: 1.3 10*3/uL (ref 0.7–4.0)
MCH: 28.4 pg (ref 26.0–34.0)
MCHC: 31.6 g/dL (ref 30.0–36.0)
MCV: 89.9 fL (ref 80.0–100.0)
Monocytes Absolute: 1.3 10*3/uL — ABNORMAL HIGH (ref 0.1–1.0)
Monocytes Relative: 6 %
Neutro Abs: 19.4 10*3/uL — ABNORMAL HIGH (ref 1.7–7.7)
Neutrophils Relative %: 83 %
Platelets: 304 10*3/uL (ref 150–400)
RBC: 3.38 MIL/uL — ABNORMAL LOW (ref 3.87–5.11)
RDW: 14.1 % (ref 11.5–15.5)
WBC: 23.2 10*3/uL — ABNORMAL HIGH (ref 4.0–10.5)
nRBC: 0 % (ref 0.0–0.2)

## 2018-10-24 LAB — APTT: aPTT: 28 seconds (ref 24–36)

## 2018-10-24 LAB — POTASSIUM: Potassium: 3 mmol/L — ABNORMAL LOW (ref 3.5–5.1)

## 2018-10-24 LAB — MAGNESIUM: Magnesium: 2.2 mg/dL (ref 1.7–2.4)

## 2018-10-24 MED ORDER — PROMETHAZINE HCL 25 MG/ML IJ SOLN: 12.5000 mg | Freq: Four times a day (QID) | INTRAMUSCULAR | Status: DC | PRN

## 2018-10-24 MED ORDER — IOHEXOL 240 MG/ML SOLN
25.0000 mL | INTRAMUSCULAR | Status: AC
  Administered 2018-10-24: 25 mL via ORAL

## 2018-10-24 MED ORDER — LOPERAMIDE HCL 2 MG PO CAPS
2.0000 mg | ORAL_CAPSULE | Freq: Two times a day (BID) | ORAL | Status: DC
  Administered 2018-10-24 – 2018-10-26 (×5): 2 mg via ORAL
  Filled 2018-10-24 (×5): qty 1

## 2018-10-24 MED ORDER — ACETAMINOPHEN 500 MG PO TABS: 1000.0000 mg | ORAL_TABLET | Freq: Four times a day (QID) | ORAL | Status: DC | PRN

## 2018-10-24 MED ORDER — OXYCODONE HCL 5 MG PO TABS
5.0000 mg | ORAL_TABLET | ORAL | Status: DC | PRN
  Filled 2018-10-24: qty 2

## 2018-10-24 MED ORDER — POTASSIUM CHLORIDE 10 MEQ/100ML IV SOLN
10.0000 meq | INTRAVENOUS | Status: AC
  Administered 2018-10-24 (×4): 10 meq via INTRAVENOUS
  Filled 2018-10-24 (×3): qty 100

## 2018-10-24 MED ORDER — PSYLLIUM 95 % PO PACK
1.0000 | PACK | Freq: Two times a day (BID) | ORAL | Status: DC
  Administered 2018-10-24 – 2018-10-26 (×5): 1 via ORAL
  Filled 2018-10-24 (×6): qty 1

## 2018-10-24 MED ORDER — BOOST / RESOURCE BREEZE PO LIQD CUSTOM: 1.0000 | Freq: Three times a day (TID) | ORAL | Status: DC

## 2018-10-24 NOTE — Progress Notes (Signed)
Pt stable after thoracentesis. F/U with her MD.

## 2018-10-24 NOTE — Progress Notes (Signed)
PT Cancellation Note  Patient Details Name: Holly Dickson MRN: [id number] DOB: Jan 17, 1951   Cancelled Treatment:    Reason Eval/Treat Not Completed: Other (comment). Evaluation attempted, however pt reports she just finished with her thoracentesis and is very fatigued and painful. Request that therapy re-attempt next date as she is not up for mobility this day. Will re-attempt. Encouraged participation and educated pt on benefits of mobility.   Rhanda Lemire 10/24/2018, 2:47 PM  Greggory Stallion, PT, DPT [phone]

## 2018-10-24 NOTE — Progress Notes (Signed)
Pharmacy Electrolyte Monitoring Consult:  Pharmacy consulted to assist in monitoring and replacing electrolytes in this 68 y.o. female admitted on 10/15/2018. Patient s/p Lap chole with cystoscopy and R ureteral stent. Post op devt of fever with abd pain, Imaging via CT showed bowel perf and tiny R apical pneumo. Next taken to OR for exLap s/p sm bowel resec then to MICU for medical optimization.   Labs:  Sodium (mmol/L)  Date Value  10/23/2018 144   Potassium (mmol/L)  Date Value  10/24/2018 3.0 (L)   Magnesium (mg/dL)  Date Value  10/23/2018 2.2   Phosphorus (mg/dL)  Date Value  10/22/2018 3.0   Calcium (mg/dL)  Date Value  10/23/2018 8.4 (L)   Albumin (g/dL)  Date Value  10/21/2018 2.1 (L)    Assessment/Plan:  K replaced w/ KCl 10 mEq IV x 4   Goal electrolytes wnl  Will obtain electrolytes with am labs.   Pharmacy will continue to monitor and adjust per consult.   Vallery Sa, PharmD Clinical Pharmacist 10/24/2018 7:10 AM

## 2018-10-24 NOTE — Progress Notes (Signed)
PT Cancellation Note  Patient Details Name: Holly Dickson MRN: [id number] DOB: 11-14-1950   Cancelled Treatment:    Reason Eval/Treat Not Completed: Medical issues which prohibited therapy. Pt has pending imaging at this time. Will hold until medically cleared.   Ashia Dehner 10/24/2018, 9:04 AM  Greggory Stallion, PT, DPT [phone]

## 2018-10-24 NOTE — Progress Notes (Signed)
[place] Hospital Day(s): 7.   Post op day(s): 6 Days Post-Op.   Interval History: Patient seen and examined, she reports an episode of nausea and emesis yesterday but feel much better this morning. She notes "it feels like a brand new day." She continues to have multiple loose, watery, non-bloody stools throughout the day yesterday. Nausea and emesis has resolved. No fever or chills.She has been NPO since yesterday. Leukocytosis did worsen to 23K this morning.    Vital signs in last 24 hours: [min-max] current  Temp:  [98.6 F (37 C)-99.3 F (37.4 C)] 99.3 F (37.4 C) (07/06 0313) Pulse Rate:  [92-101] 92 (07/06 0313) Resp:  [17-20] 20 (07/06 0313) BP: (132-156)/(80-103) 132/80 (07/06 0313) SpO2:  [98 %-99 %] 98 % (07/06 0313)     Height: 5\' 4"  (162.6 cm) Weight: 84.9 kg BMI (Calculated): 32.11   Intake/Output last 2 shifts:  07/05 0701 - 07/06 0700 In: 657.6 [I.V.:409.3; IV Piggyback:248.3] Out: 920 [Urine:700; Drains:220]   Physical Exam:  Constitutional: alert, cooperative and no distress  Respiratory: breathing non-labored at rest, on [place]  Gastrointestinal: soft, non-tender, and non-distended. No rebound/guarding. JP in RLQ with serous output.  Integumentary: Laparotomy incision is CDI with staples, mild ecchymosis, no erythema or drainage.    Labs:  CBC Latest Ref Rng & Units 10/24/2018 10/23/2018 10/22/2018  WBC 4.0 - 10.5 K/uL 23.2(H) 19.6(H) 12.0(H)  Hemoglobin 12.0 - 15.0 g/dL 9.6(L) 10.3(L) 8.1(L)  Hematocrit 36.0 - 46.0 % 30.4(L) 32.4(L) 25.1(L)  Platelets 150 - 400 K/uL 304 310 292   CMP Latest Ref Rng & Units 10/24/2018 10/24/2018 10/23/2018  Glucose 70 - 99 mg/dL 113(H) - 118(H)  BUN 8 - 23 mg/dL 33(H) - 42(H)  Creatinine 0.44 - 1.00 mg/dL 1.92(H) - 2.27(H)  Sodium 135 - 145 mmol/L 143 - 144  Potassium 3.5 - 5.1 mmol/L 3.0(L) 3.0(L) 3.1(L)  Chloride 98 - 111 mmol/L 106 - 110  CO2 22 - 32 mmol/L 27 - 26  Calcium 8.9 - 10.3  mg/dL 8.3(L) - 8.4(L)  Total Protein 6.5 - 8.1 g/dL - - -  Total Bilirubin 0.3 - 1.2 mg/dL - - -  Alkaline Phos 38 - 126 U/L - - -  AST 15 - 41 U/L - - -  ALT 0 - 44 U/L - - -     Imaging studies: No new pertinent imaging studies   Assessment/Plan:  68 y.o. female with persistent watery diarrhea negative for C diff, hypokalemia, and worsening leukocytosis although clinically her examination is reassuring with improving renal function 6 Days Post-Op s/p exploratory laparotomy and small bowel resection for small bowel perforation and 9 days s/p laparoscopic cholecystectomy for acute cholecystis    - Remain NPO this morning + IVF   - Continue IV ABx (Zosyn)  - pain control prn; antiemetics prn  - monitor abdominal examination; on-going bowel function; JP output   - Will obtain CT Chest/Abomdinal/Pelvis with PO contrast only to further evaluate for potential source of leukocytosis   - Replete K+; monitor  - Monitor leukocytosis; renal function  - Wean to RA if possible; encouraged IS  - Mobilization encouraged  - Medical management of comorbidities  - DVT prophylaxis   All of the above findings and recommendations were discussed with the patient, and the medical team, and all of patient's questions were answered to her expressed satisfaction.  -- Edison Simon, PA-C [facility] 10/24/2018, 8:21 AM [phone] M-F: 7am - 4pm

## 2018-10-24 NOTE — Care Management Important Message (Signed)
Important Message  Patient Details  Name: Holly Dickson MRN: [id number] Date of Birth: 31-Dec-1950   Medicare Important Message Given:  Yes     Juliann Pulse A Nemesis [place] 10/24/2018, 11:08 AM

## 2018-10-25 LAB — CBC WITH DIFFERENTIAL/PLATELET
Abs Immature Granulocytes: 0.66 10*3/uL — ABNORMAL HIGH (ref 0.00–0.07)
Basophils Absolute: 0.1 10*3/uL (ref 0.0–0.1)
Basophils Relative: 0 %
Eosinophils Absolute: 0.4 10*3/uL (ref 0.0–0.5)
Eosinophils Relative: 2 %
HCT: 25.4 % — ABNORMAL LOW (ref 36.0–46.0)
Hemoglobin: 8 g/dL — ABNORMAL LOW (ref 12.0–15.0)
Immature Granulocytes: 4 %
Lymphocytes Relative: 7 %
Lymphs Abs: 1.2 10*3/uL (ref 0.7–4.0)
MCH: 28.3 pg (ref 26.0–34.0)
MCHC: 31.5 g/dL (ref 30.0–36.0)
MCV: 89.8 fL (ref 80.0–100.0)
Monocytes Absolute: 1.2 10*3/uL — ABNORMAL HIGH (ref 0.1–1.0)
Monocytes Relative: 6 %
Neutro Abs: 15.2 10*3/uL — ABNORMAL HIGH (ref 1.7–7.7)
Neutrophils Relative %: 81 %
Platelets: 310 10*3/uL (ref 150–400)
RBC: 2.83 MIL/uL — ABNORMAL LOW (ref 3.87–5.11)
RDW: 14.1 % (ref 11.5–15.5)
WBC: 18.7 10*3/uL — ABNORMAL HIGH (ref 4.0–10.5)
nRBC: 0 % (ref 0.0–0.2)

## 2018-10-25 LAB — BASIC METABOLIC PANEL
Anion gap: 7 (ref 5–15)
BUN: 27 mg/dL — ABNORMAL HIGH (ref 8–23)
CO2: 28 mmol/L (ref 22–32)
Calcium: 8 mg/dL — ABNORMAL LOW (ref 8.9–10.3)
Chloride: 108 mmol/L (ref 98–111)
Creatinine, Ser: 1.55 mg/dL — ABNORMAL HIGH (ref 0.44–1.00)
GFR calc Af Amer: 40 mL/min — ABNORMAL LOW (ref >60)
GFR calc non Af Amer: 35 mL/min — ABNORMAL LOW (ref >60)
Glucose, Bld: 117 mg/dL — ABNORMAL HIGH (ref 70–99)
Potassium: 3 mmol/L — ABNORMAL LOW (ref 3.5–5.1)
Sodium: 143 mmol/L (ref 135–145)

## 2018-10-25 LAB — MAGNESIUM: Magnesium: 1.9 mg/dL (ref 1.7–2.4)

## 2018-10-25 LAB — HEMOGLOBIN AND HEMATOCRIT, BLOOD
HCT: 25.9 % — ABNORMAL LOW (ref 36.0–46.0)
Hemoglobin: 8.6 g/dL — ABNORMAL LOW (ref 12.0–15.0)

## 2018-10-25 MED ORDER — ENSURE ENLIVE PO LIQD: 237.0000 mL | Freq: Two times a day (BID) | ORAL | Status: DC

## 2018-10-25 MED ORDER — POTASSIUM CHLORIDE 10 MEQ/100ML IV SOLN
10.0000 meq | INTRAVENOUS | Status: AC
  Administered 2018-10-25 (×3): 10 meq via INTRAVENOUS
  Filled 2018-10-25 (×4): qty 100

## 2018-10-25 MED ORDER — HEPARIN SODIUM (PORCINE) 5000 UNIT/ML IJ SOLN
5000.0000 [IU] | Freq: Three times a day (TID) | INTRAMUSCULAR | Status: DC
  Administered 2018-10-25 – 2018-10-26 (×3): 5000 [IU] via SUBCUTANEOUS
  Filled 2018-10-25 (×3): qty 1

## 2018-10-25 MED ORDER — POTASSIUM CHLORIDE CRYS ER 20 MEQ PO TBCR
40.0000 meq | EXTENDED_RELEASE_TABLET | Freq: Once | ORAL | Status: AC
  Administered 2018-10-25: 40 meq via ORAL
  Filled 2018-10-25: qty 2

## 2018-10-25 MED ORDER — KCL IN DEXTROSE-NACL 20-5-0.45 MEQ/L-%-% IV SOLN
INTRAVENOUS | Status: DC
  Administered 2018-10-25 – 2018-10-26 (×3): via INTRAVENOUS
  Filled 2018-10-25 (×4): qty 1000

## 2018-10-25 NOTE — Progress Notes (Signed)
Patient states she continues to have issues with nausea. She cannot tolerate cpt with metaneb

## 2018-10-25 NOTE — Evaluation (Signed)
Physical Therapy Evaluation Patient Details Name: Holly SkeetersSandra Dickson MRN: [id number] DOB: 1950/09/24 Today's Date: 10/25/2018   History of Present Illness  Holly SkeetersSandra Dickson is a 68yoF who comes to [facility] on 6/27 with sudden onset N/V, found to have cholecystits, and incidental finding of chronically obstructing ureter stone. Prior to admission, pt was a fully inderpendent community dwelling adult. Here follows a list of significant events since arrival: 6/27: lap chole, umbilical hernia repair, then ureter stent placement; 6/29: AMB around nursing unit; 6/30: CT showing small bowel perforation in the settng of progressive leukocytosis, hydroperitoneum; PTX c pleural effusion also revealed. 7/1: small bowel resection; moved from OR to ICU. Fluid resusitation via central venous cath placement; 7/2: extubated to 2L then later to HFNC; 7/3: found to have acute CHF c fluid overload, stays on HFNC; 7/4: on 2L [place], CXR showing ?progression of atelectasis bilat; 7/5: emesis upon attempt to evaluate by PT, subsequent blood in stool per nursing; 7/6: thoracentesis centesis 800cc serosanguinous.  Clinical Impression  Pt admitted with above diagnosis. Pt currently with functional limitations due to the deficits listed below (see "PT Problem List"). Upon entry, pt in chair, awake and agreeable to participate, mostly finished with breakfast. The pt is drowsy, maintaining eyes open throughout much of sessio, although is cued to open eyes several times. Pt is oriented x4, pleasant, conversational, and generally a good historian. Supervision for transfers with RW and minA for AMB which is impaired most obviously by somnolence. No signs/symptoms associated with current H&H (8.0/25.4). Pt on 3L/min O2 upon entry, but sats well on 1L/min resting; AMB on room air with drop to 86% without dyspnea. Functional mobility assessment demonstrates increased effort/time requirements, fair tolerance, but no frank need for physical assistance,  whereas the patient performed these at a higher level of independence PTA. Pt will benefit from skilled PT intervention to increase independence and safety with basic mobility in preparation for discharge to the venue listed below.       Follow Up Recommendations Home health PT    Equipment Recommendations  Rolling walker with 5" wheels    Recommendations for Other Services       Precautions / Restrictions Precautions Precautions: Fall Precaution Comments: *ABD drain in place (RLQ?) Restrictions Weight Bearing Restrictions: No      Mobility  Bed Mobility               General bed mobility comments: up in chair upon entry and exit  Transfers Overall transfer level: Needs assistance Equipment used: Rolling walker (2 wheeled) Transfers: Sit to/from Stand Sit to Stand: Supervision         General transfer comment: moving confidently, but labored effort and use of BUE is a necessity  Ambulation/Gait Ambulation/Gait assistance: Min guard Gait Distance (Feet): 150 Feet Assistive device: Rolling walker (2 wheeled) Gait Pattern/deviations: Antalgic Gait velocity: 0.6271m/s   General Gait Details: difficulty keeping eyes open, difficulty navigatinh around obstacles x2(denies S&Sx of anemia)  Stairs            Wheelchair Mobility    Modified Rankin (Stroke Patients Only)       Balance Overall balance assessment: Needs assistance         Standing balance support: During functional activity;Bilateral upper extremity supported Standing balance-Leahy Scale: Fair Standing balance comment: sleepy                             Pertinent Vitals/Pain Pain Assessment: 0-10 Pain  Score: 4 (0/10 when resting) Pain Location: RUQ pain s/p thopracentesis Pain Descriptors / Indicators: Sharp Pain Intervention(s): Limited activity within patient's tolerance;Monitored during session;Premedicated before session;Repositioned    Home Living Family/patient  expects to be discharged to:: Private residence Living Arrangements: Spouse/significant other(DTR lives "3 doors down" c grandson) Available Help at Discharge: Family(husband working M-F 1st shift; DTR's ability ability to help is limited by work as well) Type of Home: House Home Access: Stairs to enter Entrance Stairs-Rails: None Technical brewer of Steps: 2 Home Layout: One level Home Equipment: None      Prior Function Level of Independence: Independent         Comments: Fully independent community dwelling adult, retired.     Hand Dominance   Dominant Hand: Right    Extremity/Trunk Assessment        Lower Extremity Assessment Lower Extremity Assessment: Generalized weakness;Overall [facility] for tasks assessed       Communication   Communication: No difficulties  Cognition Arousal/Alertness: Lethargic;Suspect due to medications Behavior During Therapy: [facility] for tasks assessed/performed Overall Cognitive Status: Within Functional Limits for tasks assessed                                        General Comments      Exercises     Assessment/Plan    PT Assessment Patient needs continued PT services  PT Problem List Decreased strength;Decreased activity tolerance;Decreased balance;Decreased mobility;Decreased coordination       PT Treatment Interventions Gait training;Stair training;Functional mobility training;Therapeutic activities;Therapeutic exercise;Patient/family education;Balance training;DME instruction    PT Goals (Current goals can be found in the Care Plan section)  Acute Rehab PT Goals Patient Stated Goal: Regain strength and return to home PT Goal Formulation: With patient Time For Goal Achievement: 11/08/18 Potential to Achieve Goals: Good    Frequency Min 2X/week   Barriers to discharge        Co-evaluation               AM-PAC PT "6 Clicks" Mobility  Outcome Measure Help needed turning from your back to  your side while in a flat bed without using bedrails?: A Little   Help needed moving to and from a bed to a chair (including a wheelchair)?: A Little Help needed standing up from a chair using your arms (e.g., wheelchair or bedside chair)?: A Little Help needed to walk in hospital room?: A Little Help needed climbing 3-5 steps with a railing? : A Lot 6 Click Score: 14    End of Session Equipment Utilized During Treatment: Gait belt;Oxygen Activity Tolerance: Patient tolerated treatment well;Patient limited by lethargy;Other (comment)(still somewaht nauseated) Patient left: in chair;with call bell/phone within reach;Other (comment)(left as receiveed) Nurse Communication: Mobility status PT Visit Diagnosis: Unsteadiness on feet (R26.81);Difficulty in walking, not elsewhere classified (R26.2);Muscle weakness (generalized) (M62.81);Other abnormalities of gait and mobility (R26.89)    Time: 8546-2703 PT Time Calculation (min) (ACUTE ONLY): 26 min   Charges:   PT Evaluation $PT Eval Moderate Complexity: 1 Mod PT Treatments $Therapeutic Exercise: 8-22 mins        12:13 PM, 10/25/18 Etta Grandchild, PT, DPT Physical Therapist - [facility]  [phone] ([place])   Goodman C 10/25/2018, 12:04 PM

## 2018-10-25 NOTE — TOC Progression Note (Signed)
Transition of Care [facility]) - Progression Note    Patient Details  Name: Holly Dickson MRN: [id number] Date of Birth: Jul 23, 1950  Transition of Care [facility]) CM/SW [place], [place] Phone Number: 10/25/2018, 2:49 PM  Clinical Narrative:   CSW attempted to see patient this afternoon but upon walking in to her room, patient was sitting on the side of the bed and stated she was not doing well. She stated that she was very nauseated and that she was tired of being poked with needles. CSW informed patient that her nurse would be made aware and that CSW would return tomorrow when she possibly would feel better. CSW informed nursing and nursing to follow up.          Expected Discharge Plan and Services                                                 Social Determinants of Health (SDOH) Interventions    Readmission Risk Interventions No flowsheet data found.

## 2018-10-25 NOTE — Progress Notes (Signed)
Nutrition Follow Up Note   DOCUMENTATION CODES:   Not applicable  INTERVENTION:   Ensure Enlive po BID, each supplement provides 350 kcal and 20 grams of protein  Pt at high refeed risk; recommend monitor K, Mg and P labs daily   NUTRITION DIAGNOSIS:   Inadequate oral intake related to inability to eat as evidenced by NPO status. -pt advanced to full liquid diet   GOAL:   Patient will meet greater than or equal to 90% of their needs  -progressing   MONITOR:   PO intake, Supplement acceptance, Labs, Weight trends, Skin, I & O's  ASSESSMENT:   68 year old female with PMHx of HTN who recently underwent laparoscopic cholecystectomy and right ureteral stent placement on 6/27 found to have small bowel perforation s/p exploratory laparotomy with extensive lysis of adhesions and small bowel resection (70 cm of mid to distal ileum but terminal ileum remains intact) on 7/1 and patient transferred to ICU on ventilator post-operatively with concern for developing sepsis and large right pleural effusion.   Pt advanced to clear liquid diet on 7/6; advanced to full liquids today. RD will add supplements to help pt meet her estimated needs. Pt is at high refeed risk; monitor electrolytes. Nausea and diarrhea improving. No abdominal pain or distension per MD note. JP drain with 260ml output. Per chart, pt down ~8lbs since admit; RD will continue to monitor.   Medications reviewed and include: imodium, protonix, psyllium, LRS @75ml /hr, zosyn  Labs reviewed: K 3.0(L), BUN 27(H), creat 1.55(H). Mg 1.9 P 3.0 wnl- 7/4 Hgb 8.6(L), Hct 25.9(L)  Diet Order:   Diet Order            Diet full liquid Room service appropriate? Yes; Fluid consistency: Thin  Diet effective now             EDUCATION NEEDS:   No education needs have been identified at this time  Skin:  Skin Assessment: Skin Integrity Issues:(closed incision to abdomen)  Last BM:  7/6- type 7  Height:   Ht Readings from Last 1  Encounters:  10/24/18 5\' 4"  (1.626 m)   Weight:   Wt Readings from Last 1 Encounters:  10/25/18 82.1 kg   Ideal Body Weight:  54.5 kg  BMI:  Body mass index is 31.07 kg/m.  Estimated Nutritional Needs:   Kcal:  1500-1700kcal/day  Protein:  75-85g/day  Fluid:  1.6L/day  Koleen Distance MS, RD, LDN Pager #- [phone] Office#- [phone] After Hours Pager: [phone]

## 2018-10-25 NOTE — Progress Notes (Signed)
[place] Hospital Day(s): 8.   Post op day(s): 7 Days Post-Op.   Interval History: Patient seen and examined, no acute events or new complaints overnight. Patient reports she is feeling better this morning. She had some nausea with KDUR this morning but otherwise is without fever, chills, nausea, or emesis. She has tolerated full liquids without issue. Diarrhea has improved since starting imodium. Leukocytosis improved to 18K, still with hypokalemia (3.0). Tolerated thoracentesis well yesterday. No complaints of SOB/CP. Still on [place]. JP with 230 ccs out.    Vital signs in last 24 hours: [min-max] current  Temp:  [98.7 F (37.1 C)] 98.7 F (37.1 C) (07/07 0317) Pulse Rate:  [82-90] 87 (07/07 0317) Resp:  [19-20] 20 (07/07 0317) BP: (121-158)/(74-83) 158/79 (07/07 0317) SpO2:  [98 %-100 %] 100 % (07/07 0317) Weight:  [82.1 kg] 82.1 kg (07/07 0500)     Height: 5\' 4"  (162.6 cm) Weight: 82.1 kg BMI (Calculated): 31.05   Intake/Output last 2 shifts:  07/06 0701 - 07/07 0700 In: 2772.7 [P.O.:360; I.V.:1798.4; IV Piggyback:614.3] Out: 230 [Drains:230]   Physical Exam:  Constitutional: alert, cooperative and no distress  Respiratory: breathing non-labored at rest, on [place]  Gastrointestinal: soft, non-tender, and non-distended. No rebound/guarding. JP in RLQ with serous output.  Integumentary: Laparotomy incision is CDI with staples, mild ecchymosis, no erythema or drainage.   Labs:  CBC Latest Ref Rng & Units 10/25/2018 10/24/2018 10/23/2018  WBC 4.0 - 10.5 K/uL 18.7(H) 23.2(H) 19.6(H)  Hemoglobin 12.0 - 15.0 g/dL 8.0(L) 9.6(L) 10.3(L)  Hematocrit 36.0 - 46.0 % 25.4(L) 30.4(L) 32.4(L)  Platelets 150 - 400 K/uL 310 304 310   CMP Latest Ref Rng & Units 10/25/2018 10/24/2018 10/24/2018  Glucose 70 - 99 mg/dL 117(H) 113(H) -  BUN 8 - 23 mg/dL 27(H) 33(H) -  Creatinine 0.44 - 1.00 mg/dL 1.55(H) 1.92(H) -  Sodium 135 - 145 mmol/L 143 143 -  Potassium 3.5 -  5.1 mmol/L 3.0(L) 3.0(L) 3.0(L)  Chloride 98 - 111 mmol/L 108 106 -  CO2 22 - 32 mmol/L 28 27 -  Calcium 8.9 - 10.3 mg/dL 8.0(L) 8.3(L) -  Total Protein 6.5 - 8.1 g/dL - - -  Total Bilirubin 0.3 - 1.2 mg/dL - - -  Alkaline Phos 38 - 126 U/L - - -  AST 15 - 41 U/L - - -  ALT 0 - 44 U/L - - -     Imaging studies: No new pertinent imaging studies   Assessment/Plan: 68 y.o. female with mild anemia lightly dilution, no signs of active bleeding, otherwise overall doing better this morning with improved leukocytosis and diarrhea without any significant abdominal pain 7 Days Post-Op s/p exploratory laparotomy and small bowel resection for small bowel perforation and 9 days s/p laparoscopic cholecystectomy for acute cholecystis    - Advance diet as tolerate + IVF   - Continue IV ABx (Zosyn)             - pain control prn; antiemetics prn             - monitor abdominal examination; on-going bowel function; JP output    - Replete K+; monitor  - Monitor hemoglobin; will repeat this afternoon             - Monitor leukocytosis; renal function             - Wean to RA if possible; encouraged IS             -  Mobilization encouraged; PT engaged             - Medical management of comorbidities             - DVT prophylaxis    All of the above findings and recommendations were discussed with the patient, and the medical team, and all of patient's questions were answered to her expressed satisfaction.  -- Lynden OxfordZachary Hallee Mckenny, PA-C [facility] 10/25/2018, 7:36 AM [phone] M-F: 7am - 4pm

## 2018-10-25 NOTE — Plan of Care (Signed)
Patient doing ok today.  She has not taken in much of her meals due to being nauseated but no emesis.  Zofran x1.  Patient ambulated in the hallway with PT a short distance then sat up in the chair for a few hours.  She has only had 2 loose BMs today.  Incisions are C/D/I. JP with small amount of output.  No significant changes.  I spoke to the patient's husband and gave him an update.

## 2018-10-25 NOTE — Progress Notes (Signed)
Pharmacy Electrolyte Monitoring Consult:  Pharmacy consulted to assist in monitoring and replacing electrolytes in this 68 y.o. female admitted on 10/15/2018. Patient s/p Lap chole with cystoscopy and R ureteral stent. Post op devt of fever with abd pain, Imaging via CT showed bowel perf and tiny R apical pneumo. Next taken to OR for exLap s/p sm bowel resec then to MICU for medical optimization.   Labs:  Sodium (mmol/L)  Date Value  10/25/2018 143   Potassium (mmol/L)  Date Value  10/25/2018 3.0 (L)   Magnesium (mg/dL)  Date Value  10/25/2018 1.9   Phosphorus (mg/dL)  Date Value  10/22/2018 3.0   Calcium (mg/dL)  Date Value  10/25/2018 8.0 (L)   Albumin (g/dL)  Date Value  10/21/2018 2.1 (L)    Assessment/Plan:  K replaced w/ KCl 10 mEq IV x 4 + KCl 40 mEq PO x1 ordered by MD  Goal electrolytes wnl  Will obtain electrolytes with am labs.   Pharmacy will continue to monitor and adjust per consult.   Holly Dickson, PharmD, BCPS Clinical Pharmacist 10/25/2018 7:54 AM

## 2018-10-26 ENCOUNTER — Inpatient Hospital Stay: Payer: Medicare HMO

## 2018-10-26 DIAGNOSIS — J90 Pleural effusion, not elsewhere classified: Secondary | ICD-10-CM

## 2018-10-26 LAB — CBC WITH DIFFERENTIAL/PLATELET
Abs Immature Granulocytes: 0.76 10*3/uL — ABNORMAL HIGH (ref 0.00–0.07)
Basophils Absolute: 0.1 10*3/uL (ref 0.0–0.1)
Basophils Relative: 0 %
Eosinophils Absolute: 0.4 10*3/uL (ref 0.0–0.5)
Eosinophils Relative: 2 %
HCT: 27 % — ABNORMAL LOW (ref 36.0–46.0)
Hemoglobin: 8.6 g/dL — ABNORMAL LOW (ref 12.0–15.0)
Immature Granulocytes: 4 %
Lymphocytes Relative: 7 %
Lymphs Abs: 1.4 10*3/uL (ref 0.7–4.0)
MCH: 28.4 pg (ref 26.0–34.0)
MCHC: 31.9 g/dL (ref 30.0–36.0)
MCV: 89.1 fL (ref 80.0–100.0)
Monocytes Absolute: 1.5 10*3/uL — ABNORMAL HIGH (ref 0.1–1.0)
Monocytes Relative: 7 %
Neutro Abs: 16.7 10*3/uL — ABNORMAL HIGH (ref 1.7–7.7)
Neutrophils Relative %: 80 %
Platelets: 355 10*3/uL (ref 150–400)
RBC: 3.03 MIL/uL — ABNORMAL LOW (ref 3.87–5.11)
RDW: 14.2 % (ref 11.5–15.5)
WBC: 20.9 10*3/uL — ABNORMAL HIGH (ref 4.0–10.5)
nRBC: 0 % (ref 0.0–0.2)

## 2018-10-26 LAB — URINALYSIS, ROUTINE W REFLEX MICROSCOPIC
Bacteria, UA: NONE SEEN
Bilirubin Urine: NEGATIVE
Glucose, UA: NEGATIVE mg/dL
Ketones, ur: NEGATIVE mg/dL
Leukocytes,Ua: NEGATIVE
Nitrite: NEGATIVE
Protein, ur: 30 mg/dL — AB
RBC / HPF: >50 RBC/hpf — ABNORMAL HIGH (ref 0–5)
Specific Gravity, Urine: 1.013 (ref 1.005–1.030)
pH: 7 (ref 5.0–8.0)

## 2018-10-26 LAB — BASIC METABOLIC PANEL
Anion gap: 10 (ref 5–15)
BUN: 21 mg/dL (ref 8–23)
CO2: 26 mmol/L (ref 22–32)
Calcium: 8.2 mg/dL — ABNORMAL LOW (ref 8.9–10.3)
Chloride: 106 mmol/L (ref 98–111)
Creatinine, Ser: 1.43 mg/dL — ABNORMAL HIGH (ref 0.44–1.00)
GFR calc Af Amer: 44 mL/min — ABNORMAL LOW (ref >60)
GFR calc non Af Amer: 38 mL/min — ABNORMAL LOW (ref >60)
Glucose, Bld: 135 mg/dL — ABNORMAL HIGH (ref 70–99)
Potassium: 3.4 mmol/L — ABNORMAL LOW (ref 3.5–5.1)
Sodium: 142 mmol/L (ref 135–145)

## 2018-10-26 LAB — MAGNESIUM: Magnesium: 1.9 mg/dL (ref 1.7–2.4)

## 2018-10-26 LAB — SARS CORONAVIRUS 2 BY RT PCR (HOSPITAL ORDER, PERFORMED IN ~~LOC~~ HOSPITAL LAB): SARS Coronavirus 2: NEGATIVE

## 2018-10-26 LAB — PHOSPHORUS: Phosphorus: 1.9 mg/dL — ABNORMAL LOW (ref 2.5–4.6)

## 2018-10-26 MED ORDER — OXYCODONE HCL 5 MG PO TABS: 5.0000 mg | ORAL_TABLET | ORAL | 0 refills | Status: DC | PRN

## 2018-10-26 MED ORDER — ONDANSETRON 4 MG PO TBDP: 4.0000 mg | ORAL_TABLET | Freq: Four times a day (QID) | ORAL | 0 refills | Status: DC | PRN

## 2018-10-26 MED ORDER — ONDANSETRON HCL 4 MG/2ML IJ SOLN: 4.0000 mg | Freq: Four times a day (QID) | INTRAMUSCULAR | 0 refills | Status: DC | PRN

## 2018-10-26 MED ORDER — PANTOPRAZOLE SODIUM 40 MG IV SOLR: 40.0000 mg | Freq: Every day | INTRAVENOUS | Status: DC

## 2018-10-26 MED ORDER — ACETAMINOPHEN 500 MG PO TABS: 1000.0000 mg | ORAL_TABLET | Freq: Four times a day (QID) | ORAL | 0 refills | Status: AC | PRN

## 2018-10-26 MED ORDER — PSYLLIUM 95 % PO PACK: 1.0000 | PACK | Freq: Two times a day (BID) | ORAL | Status: DC

## 2018-10-26 MED ORDER — KCL IN DEXTROSE-NACL 20-5-0.45 MEQ/L-%-% IV SOLN: 75.0000 mL/h | INTRAVENOUS | Status: DC

## 2018-10-26 MED ORDER — PROMETHAZINE HCL 25 MG/ML IJ SOLN: 12.5000 mg | Freq: Four times a day (QID) | INTRAMUSCULAR | 0 refills | Status: DC | PRN

## 2018-10-26 MED ORDER — POTASSIUM PHOSPHATES 15 MMOLE/5ML IV SOLN: 10.0000 mmol | Freq: Once | INTRAVENOUS | Status: AC

## 2018-10-26 MED ORDER — ORAL CARE MOUTH RINSE: 15.0000 mL | Freq: Two times a day (BID) | OROMUCOSAL | 0 refills | Status: DC

## 2018-10-26 MED ORDER — POTASSIUM PHOSPHATES 15 MMOLE/5ML IV SOLN
10.0000 mmol | Freq: Once | INTRAVENOUS | Status: AC
  Administered 2018-10-26: 10 mmol via INTRAVENOUS
  Filled 2018-10-26: qty 3.33

## 2018-10-26 MED ORDER — ENSURE ENLIVE PO LIQD: 237.0000 mL | Freq: Two times a day (BID) | ORAL | 12 refills | Status: DC

## 2018-10-26 MED ORDER — MORPHINE SULFATE (PF) 2 MG/ML IV SOLN: 1.0000 mg | INTRAVENOUS | 0 refills | Status: DC | PRN

## 2018-10-26 MED ORDER — IPRATROPIUM-ALBUTEROL 0.5-2.5 (3) MG/3ML IN SOLN: 3.0000 mL | RESPIRATORY_TRACT | Status: DC | PRN

## 2018-10-26 MED ORDER — HEPARIN SODIUM (PORCINE) 5000 UNIT/ML IJ SOLN: 5000.0000 [IU] | Freq: Three times a day (TID) | INTRAMUSCULAR | Status: DC

## 2018-10-26 MED ORDER — LOPERAMIDE HCL 2 MG PO CAPS: 2.0000 mg | ORAL_CAPSULE | Freq: Two times a day (BID) | ORAL | 0 refills | Status: DC

## 2018-10-26 MED ORDER — PIPERACILLIN-TAZOBACTAM 3.375 G IVPB: 3.3750 g | Freq: Three times a day (TID) | INTRAVENOUS | Status: DC

## 2018-10-26 NOTE — Progress Notes (Addendum)
Pharmacy Electrolyte Monitoring Consult:  Pharmacy consulted to assist in monitoring and replacing electrolytes in this 68 y.o. female admitted on 10/15/2018. Patient s/p Lap chole with cystoscopy and R ureteral stent. Post op devt of fever with abd pain, Imaging via CT showed bowel perf and tiny R apical pneumo. Next taken to OR for exLap s/p sm bowel resec then to MICU for medical optimization.   Labs:  Sodium (mmol/L)  Date Value  10/26/2018 142   Potassium (mmol/L)  Date Value  10/26/2018 3.4 (L)   Magnesium (mg/dL)  Date Value  10/26/2018 1.9   Phosphorus (mg/dL)  Date Value  10/26/2018 1.9 (L)   Calcium (mg/dL)  Date Value  10/26/2018 8.2 (L)   Albumin (g/dL)  Date Value  10/21/2018 2.1 (L)    Assessment/Plan:  Patient is currently receiving D5 1/2NS w/ 20 mEq KCl/L at 75 mL/hr  Potassium is still borderline low and now with a degree of hypophosphatemia: replete with 10 mmol IV potassium phosphate. This will provide 14.7 mEq elemental potassium  Goal:  electrolytes wnl  Will obtain electrolytes with am labs.   Pharmacy will continue to monitor and adjust per consult.   Vallery Sa, PharmD Clinical Pharmacist 10/26/2018 9:23 AM

## 2018-10-26 NOTE — Discharge Summary (Signed)
Patient ID: Holly SkeetersSandra Wisher MRN: [id number] DOB/AGE: 1950/11/09 68 y.o.  Admit date: 10/15/2018 Discharge date: 10/26/2018   Discharge Diagnoses:  Active Problems:   Acute cholecystitis   Ureteral stone   Umbilical hernia without obstruction and without gangrene   Pneumoperitoneum   Pleural effusion   Procedures: 1.  Laparoscopic cholecystectomy 2.  Cystoscopy with right ureteral stent placement 3.  Exploratory laparotomy with small bowel resection 4.  Right femoral central line placement 4.  Right thoracentesis  Hospital Course: Patient was admitted on 6/27 after presenting with acute cholecystitis. She was also noted to have severe right hydronephrosis on ultrasound and CT scan in the ED.  Urology was consulted and she was taken to the OR on 6/27 for laparoscopic cholecystectomy and cystoscopy with right ureteral stent placement.  She tolerated the procedures well.  However, on 6/29, her WBC started trending up and she started developing low grade temps.  She had a CT scan of abdomen and pelvis on 6/30, which noted oral contrast extravasation from small bowel near the umbilicus as well as a moderate right pleural effusion.  Clinically she was deteriorating and decision was made to go to the OR.  She was taken that same night to the OR and underwent an exploratory laparotomy, requiring 70 cm segment of small bowel to be resected due to a bowel perforation and significant adhesions from her prior surgeries.  Post-operatively, she was in the ICU for management of her sepsis.  This required a right femoral central line for norepinephrine infusion.  She recovered well and was eventually extubated.  In ICU, she had ultrasound of her right chest and initially there was not as much pleural effusion to drain.  Her oxygen requirement was improving, her pain was well controlled, and her renal function was improving.  However, her WBC started increasing on 7/5 and on 7/6 had repeat CT scan of chest,  abdomen, and pelvis.  There was again a large right pleural effusion, but otherwise no abscess and only some residual intra-abdominal fluid with no other pathology.  Interventional radiology was consulted and a right thoracentesis was done on the same day.  This revealed 800 ml of dark serosanguinous fluid, with cultures negative thus far.  She did feel better afterwards and her WBC improved temporarily.  Today, however, 7/8, her oxygen requirement increased again, her WBC increased again, and repeat CXR showed reaccumulation of her right pleural effusion.  She also had been having some failure to thrive, and is not eating much and not being mobile.  IR again was consulted and on ultrasound, noted that the effusion was more loculated.  Thoracic surgery was consulted and were recommending repeat CT chest with CT guided right chest tube placement, with plans for TPA through the tube.  The patient and her husband requested transfer to tertiary center given the prolonged hospital course, lack of significant progression, and wanted more people involved in her care to hopefully help her better.  [facility] was contacted and Dr. Carlynn PurlPerez has graciously accepted her in transfer.  On exam, she is afebrile, with stable vital signs, and in no acute distress.  Her abdomen is soft, non-distended, non-tender to palpation.  Midline incision is clean, dry, intact with staples in place.  Prior laparoscopy incisions are clean, dry, with DermaBond.  Right sided 19 Fr. Blake drain with serosanguinous fluid.  There is some peripheral, non-pitting edema of lower extremities.  PT has recommended home health PT.  She's currently on Zosyn for her intra-abdominal infection.  Consults: Urology, ICU, IR, PT, Thoracic surgery.  Disposition: Discharge disposition: [facility] Not Defined       Discharge Instructions    Call MD for:  difficulty breathing, headache or visual disturbances   Complete by: As directed     Call MD for:  persistant nausea and vomiting   Complete by: As directed    Call MD for:  redness, tenderness, or signs of infection (pain, swelling, redness, odor or green/yellow discharge around incision site)   Complete by: As directed    Call MD for:  severe uncontrolled pain   Complete by: As directed    Call MD for:  temperature >100.4   Complete by: As directed    Change dressing (specify)   Complete by: As directed    Change dressing around [place] drain once daily.  May leave midline incision open to air.   Diet - low sodium heart healthy   Complete by: As directed    Discharge instructions   Complete by: As directed    1.  Patient may shower, but do not scrub wounds heavily and dab dry only. 2.  Do not submerge wounds in pool/tub for 1 week. 3.  Do not remove staples until done by MD, either in hospital or in office. 4.  Do not remove Blake drain.  Please empty and record daily output.   Driving Restrictions   Complete by: As directed    Do not drive while taking narcotics for pain control.   Increase activity slowly   Complete by: As directed    Lifting restrictions   Complete by: As directed    No heavy lifting or pushing of more than 10-15 lbs for 4 weeks.     Allergies as of 10/26/2018   No Known Allergies     Medication List    TAKE these medications   acetaminophen 500 MG tablet Commonly known as: TYLENOL Take 2 tablets (1,000 mg total) by mouth every 6 (six) hours as needed for mild pain or fever. Notes to patient: As needed   dextrose 5 % and 0.45 % NaCl with KCl 20 mEq/L 20-5-0.45 MEQ/L-%-% Inject 75 mL/hr into the vein continuous. Notes to patient: continuous   escitalopram 20 MG tablet Commonly known as: LEXAPRO Take 20 mg by mouth daily. Notes to patient: Morning 10/27/18   feeding supplement (ENSURE ENLIVE) Liqd Take 237 mLs by mouth 2 (two) times daily between meals.   heparin 5000 UNIT/ML injection Inject 1 mL (5,000 Units total) into the skin  every 8 (eight) hours. Notes to patient: Bedtime 10/27/18   ipratropium-albuterol 0.5-2.5 (3) MG/3ML Soln Commonly known as: DUONEB Take 3 mLs by nebulization every 4 (four) hours as needed. Notes to patient: As needed   lisinopril-hydrochlorothiazide 20-25 MG tablet Commonly known as: ZESTORETIC Take 1 tablet by mouth daily. Notes to patient: Morning 10/27/18   loperamide 2 MG capsule Commonly known as: IMODIUM Take 1 capsule (2 mg total) by mouth 2 (two) times a day. Notes to patient: Before bed 10/27/18   morphine 2 MG/ML injection Inject 0.5-1 mLs (1-2 mg total) into the vein every 4 (four) hours as needed. Notes to patient: As needed   mouth rinse Liqd solution 15 mLs by Mouth Rinse route 2 (two) times daily. Notes to patient: Before bed 10/27/18   ondansetron 4 MG disintegrating tablet Commonly known as: ZOFRAN-ODT Take 1 tablet (4 mg total) by mouth every 6 (six) hours as needed for nausea. Notes to patient:  As needed   ondansetron 4 MG/2ML Soln injection Commonly known as: ZOFRAN Inject 2 mLs (4 mg total) into the vein every 6 (six) hours as needed for nausea. Notes to patient: As needed   oxyCODONE 5 MG immediate release tablet Commonly known as: Oxy IR/ROXICODONE Take 1-2 tablets (5-10 mg total) by mouth every 4 (four) hours as needed for moderate pain or severe pain. Notes to patient: As needed   pantoprazole 40 MG injection Commonly known as: PROTONIX Inject 40 mg into the vein at bedtime. Notes to patient: Before bed 10/26/18   phenazopyridine 200 MG tablet Commonly known as: Pyridium Take 1 tablet (200 mg total) by mouth 3 (three) times daily as needed for pain. Notes to patient: As needed   piperacillin-tazobactam 3.375 GM/50ML IVPB Commonly known as: ZOSYN Inject 50 mLs (3.375 g total) into the vein every 8 (eight) hours. Notes to patient: Before bed 10/27/18   potassium PHOSPHATE 10 mmol in dextrose 5 % 250 mL Inject 10 mmol into the vein once for 1  dose.   promethazine 25 MG/ML injection Commonly known as: PHENERGAN Inject 0.5 mLs (12.5 mg total) into the vein every 6 (six) hours as needed for nausea or vomiting. Notes to patient: As needed   psyllium 95 % Pack Commonly known as: HYDROCIL/METAMUCIL Take 1 packet by mouth 2 (two) times daily. Notes to patient: Before bed 10/26/18   tamsulosin 0.4 MG Caps capsule Commonly known as: FLOMAX Take 1 capsule (0.4 mg total) by mouth daily. Notes to patient: Before bed 10/27/18            Durable Medical Equipment  (From admission, onward)         Start     Ordered   10/26/18 0811  For home use only DME Walker rolling  Once    Question:  Patient needs a walker to treat with the following condition  Answer:  Physical deconditioning   10/26/18 0812           Discharge Care Instructions  (From admission, onward)         Start     Ordered   10/26/18 0000  Change dressing (specify)    Comments: Change dressing around [place] drain once daily.  May leave midline incision open to air.   10/26/18 [facility]. Schedule an appointment as soon as possible for a visit in 2 weeks.   Specialty: Urology Contact information: [place], [place] [phone]       Olean Ree, MD Follow up in 1 week(s).   Specialty: General Surgery Why: Follow up 1 week after discharge with me, or if prefer, with surgery team at [facility]. Contact information: [address] [place] [address] [phone]

## 2018-10-26 NOTE — Progress Notes (Signed)
Patient ID: Holly Dickson, female   DOB: 1951-04-07, 68 y.o.   MRN: [id number]  No chief complaint on file.   Referred By Dr. Henrene DodgeJose Piscoya Reason for Referral loculated right pleural effusion  HPI Location, Quality, Duration, Severity, Timing, Context, Modifying Factors, Associated Signs and Symptoms.  Holly Dickson is a 68 y.o. female.  I have been asked to see Holly Dickson in consultation regarding her loculated right pleural effusion.  She was admitted to the hospital with acute cholecystitis and underwent a laparoscopic cholecystectomy.  Intraoperatively there is a small bowel perforation which required subsequent exploratory laparotomy and small bowel resection several days later.  She developed a right pleural effusion which underwent successful ultrasound-guided thoracentesis.  Over the next several days her pleural effusion has recurred and she had a ultrasound of the chest today in preparation for another thoracentesis.  Unfortunately that could not be accomplished because the fluid was loculated with multiple septations.  I was asked to see the patient for consideration of any additional work-up or therapy.  The patient's husband has been in consultation with Dr. Aleen CampiPiscoya and [facility].  Currently the patient is resting comfortably in bed.  Although she is tired she is able to understand and completely be involved in her care.  I completed my interview with the patient's husband on the phone as well so that he could also hear our conversations.   Past Medical History:  Diagnosis Date  . Hypertension     Past Surgical History:  Procedure Laterality Date  . BOWEL RESECTION N/A 10/18/2018   Procedure: SMALL BOWEL RESECTION;  Surgeon: Ancil Linseyavis, Jason Evan, MD;  Location: [facility] ORS;  Service: General;  Laterality: N/A;  . CHOLECYSTECTOMY N/A 10/15/2018   Procedure: LAPAROSCOPIC CHOLECYSTECTOMY with umbilical hernia repair;  Surgeon:  Henrene DodgePiscoya, Jose, MD;  Location: [facility] ORS;  Service: General;  Laterality: N/A;  . CYSTOSCOPY WITH STENT PLACEMENT Right 10/15/2018   Procedure: CYSTOSCOPY WITH STENT PLACEMENT,right retrograde pyelogram;  Surgeon: Crist FatHerrick, Benjamin W, MD;  Location: [facility] ORS;  Service: Urology;  Laterality: Right;  . LAPAROTOMY N/A 10/18/2018   Procedure: EXPLORATORY LAPAROTOMY;  Surgeon: Ancil Linseyavis, Jason Evan, MD;  Location: [facility] ORS;  Service: General;  Laterality: N/A;    History reviewed. No pertinent family history.  Social History Social History   Tobacco Use  . Smoking status: Never Smoker  . Smokeless tobacco: Never Used  Substance Use Topics  . Alcohol use: Not Currently  . Drug use: Never    No Known Allergies  Current Facility-Administered Medications  Medication Dose Route Frequency Provider Last Rate Last Dose  . 0.9 %  sodium chloride infusion   Intravenous PRN Piscoya, Jose, MD 10 mL/hr at 10/26/18 1452 250 mL at 10/26/18 1452  . acetaminophen (TYLENOL) tablet 1,000 mg  1,000 mg Oral Q6H PRN Piscoya, Jose, MD      . dextrose 5 % and 0.45 % NaCl with KCl 20 mEq/L infusion   Intravenous Continuous Piscoya, Jose, MD 75 mL/hr at 10/26/18 0458    . escitalopram (LEXAPRO) tablet 10 mg  10 mg Oral Daily Bertram SavinSimpson, Michael L, RPH   10 mg at 10/26/18 [id number]  . feeding supplement (ENSURE ENLIVE) (ENSURE ENLIVE) liquid 237 mL  237 mL Oral BID BM Donovan KailSchulz, Zachary R, PA-C   Stopped at 10/26/18 [id number]  . heparin injection 5,000 Units  5,000 Units Subcutaneous Q8H Henrene DodgePiscoya, Jose, MD   5,000 Units at 10/26/18 1502  . ipratropium-albuterol (DUONEB) 0.5-2.5 (  3) MG/3ML nebulizer solution 3 mL  3 mL Nebulization Q4H PRN Harlon DittyKeene, Jeremiah D, NP   3 mL at 10/24/18 1308  . loperamide (IMODIUM) capsule 2 mg  2 mg Oral BID Donovan KailSchulz, Zachary R, PA-C   2 mg at 10/26/18 [id number]  . MEDLINE mouth rinse  15 mL Mouth Rinse BID Judithe ModestKeene, Jeremiah D, NP   Stopped at 10/26/18 [id number]  . morphine 2 MG/ML injection 1-2 mg  1-2 mg Intravenous Q4H PRN  Eugenie NorrieBlakeney, Dana G, NP   2 mg at 10/26/18 1406  . ondansetron (ZOFRAN-ODT) disintegrating tablet 4 mg  4 mg Oral Q6H PRN Piscoya, Jose, MD       Or  . ondansetron (ZOFRAN) injection 4 mg  4 mg Intravenous Q6H PRN Piscoya, Jose, MD   4 mg at 10/25/18 1508  . oxyCODONE (Oxy IR/ROXICODONE) immediate release tablet 5-10 mg  5-10 mg Oral Q4H PRN Piscoya, Jose, MD      . pantoprazole (PROTONIX) injection 40 mg  40 mg Intravenous QHS Piscoya, Jose, MD   40 mg at 10/25/18 2047  . piperacillin-tazobactam (ZOSYN) IVPB 3.375 g  3.375 g Intravenous Q8H Piscoya, Jose, MD 12.5 mL/hr at 10/26/18 0459 3.375 g at 10/26/18 0459  . potassium PHOSPHATE 10 mmol in dextrose 5 % 250 mL infusion  10 mmol Intravenous Once Lowella BandyGrubb, Rodney D, RPH 42 mL/hr at 10/26/18 1453 10 mmol at 10/26/18 1453  . promethazine (PHENERGAN) injection 12.5 mg  12.5 mg Intravenous Q6H PRN Donovan KailSchulz, Zachary R, PA-C      . psyllium (HYDROCIL/METAMUCIL) packet 1 packet  1 packet Oral BID Henrene DodgePiscoya, Jose, MD   1 packet at 10/26/18 [phone]      Review of Systems A complete review of systems was asked and was negative except for the following positive findingsnone  Blood pressure (!) 143/80, pulse 80, temperature 98.5 F (36.9 C), temperature source Oral, resp. rate 19, height 5\' 4"  (1.626 m), weight 82.9 kg, SpO2 93 %.  Physical Exam CONSTITUTIONAL:  Pleasant, well-developed, well-nourished, and in no acute distress. EYES: Pupils equal and reactive to light, Sclera non-icteric EARS, NOSE, MOUTH AND THROAT:  The oropharynx was clear.  Dentition is good repair.  Oral mucosa pink and moist. LYMPH NODES:  Lymph nodes in the neck and axillae were normal RESPIRATORY:  Lungs were clear on the left but markedly diminished on the right.  Normal respiratory effort without pathologic use of accessory muscles of respiration CARDIOVASCULAR: Heart was regular without murmurs.  There were no carotid bruits. GI: The abdomen was soft, nontender, and nondistended.  There were no palpable masses. There was no hepatosplenomegaly. There were normal bowel sounds in all quadrants but they were hypoactive.  She has a drain present in the right upper quadrant which is draining small amounts of serous fluid. GU:  Rectal deferred.   MUSCULOSKELETAL:  Normal muscle strength and tone.  No clubbing or cyanosis.   SKIN:  There were no pathologic skin lesions.  There were no nodules on palpation. NEUROLOGIC:  Sensation is normal.  Cranial nerves are grossly intact. PSYCH:  Oriented to person, place and time.  Mood and affect are normal.  Data Reviewed CT scans and chest x-rays  I have personally reviewed the patient's imaging, laboratory findings and medical records.    Assessment    Loculated right pleural effusion presumed secondary to intra-abdominal process    Plan    I had a long discussion with the patient and her husband today.  I reviewed with  him the options.  At the present time given the loculations that are present in the pleural space the only option would be a image guided drain with subsequent TPA or a thoracoscopy/thoracotomy.  I explained to the patient and her husband that intrapleural thrombolytics have a relative contraindication and that she is undergone recent surgery however neither the patient nor her husband believe that they could undergo a third major operation so quickly.  Therefore they have ruled out the possibility of a thoracoscopy or thoracotomy at this point in time.  I did explain to them that the intrapleural thrombolytics can be systemically absorbed with significant complications that may require additional surgery or blood transfusion.  They understand that.  I also discussed with him the possible transfer to [facility].  They are desirous of that.  I told him that if this can be arranged in a timely fashion that it would be better to allow them to perform the procedure at their institution.  At the completion of my  interview I believe that they had all their questions answered.  The patient and her husband appeared to understand the ramifications of all decision making.       Nestor Lewandowsky, MD 10/26/2018, 4:03 PM

## 2018-10-26 NOTE — Progress Notes (Signed)
Patient transferred to [facility] room 5211. Report given to Edsel Petrin, RN. [facility]  Took patient with her belongings. Husband notified. Paperwork with [employer]. VSS. Patient left with IVs intact and fluids running.

## 2018-10-26 NOTE — Progress Notes (Signed)
[facility] SURGICAL PROGRESS NOTE  Hospital Day(s): 9.   Post op day(s): 8 Days Post-Op.   Interval History: Patient seen and examined, she did have some complaints of nausea at the end of yesterday and overnight but this is resolved this morning. No complaints of abdominal pain or emesis. She does report some central-right sided chest discomfort this morning which she believes is related to her thoracentesis. No increasing SOB still on 1.5L [place]. Leukocytosis worsened 20.9K, Improved Hypokalemia 3.4, Renal function improving, good U/O. Diarrhea improved, now with 3 BMs in last 24 hours which are formed. JP with 100 ccs serous output. PT recommending home health.   Vital signs in last 24 hours: [min-max] current  Temp:  [98.2 F (36.8 C)-98.7 F (37.1 C)] 98.7 F (37.1 C) (07/08 0353) Pulse Rate:  [82-92] 92 (07/08 0353) Resp:  [16-20] 16 (07/08 0353) BP: (145-150)/(78-87) 150/87 (07/08 0353) SpO2:  [86 %-94 %] 91 % (07/08 0353) Weight:  [82.9 kg] 82.9 kg (07/08 0500)     Height: 5\' 4"  (162.6 cm) Weight: 82.9 kg BMI (Calculated): 31.34   Intake/Output last 2 shifts:  07/07 0701 - 07/08 0700 In: 1966.6 [P.O.:160; I.V.:1635.7; IV Piggyback:170.8] Out: 300 [Urine:200; Drains:100]   Physical Exam:  Constitutional: alert, cooperative and no distress  Respiratory: breathing non-labored at rest  Cardiovascular: regular rate and sinus rhythm  Gastrointestinal: soft, non-tender, and non-distended. No rebound/guarding. JP in RLQ with serous output. Integumentary:Laparotomy incision is CDI with staples, mild ecchymosis, no erythema or drainage. Musculoskeletal: No peripheral edema.   Labs:  CBC Latest Ref Rng & Units 10/26/2018 10/25/2018 10/25/2018  WBC 4.0 - 10.5 K/uL 20.9(H) - 18.7(H)  Hemoglobin 12.0 - 15.0 g/dL 1.6(X8.6(L) 0.9(U8.6(L) 0.4(V8.0(L)  Hematocrit 36.0 - 46.0 % 27.0(L) 25.9(L) 25.4(L)  Platelets 150 - 400 K/uL 355 - 310   CMP Latest Ref Rng & Units 10/26/2018 10/25/2018 10/24/2018   Glucose 70 - 99 mg/dL 409(W135(H) 119(J117(H) 478(G113(H)  BUN 8 - 23 mg/dL 21 95(A27(H) 21(H33(H)  Creatinine 0.44 - 1.00 mg/dL 0.86(V1.43(H) 7.84(O1.55(H) 9.62(X1.92(H)  Sodium 135 - 145 mmol/L 142 143 143  Potassium 3.5 - 5.1 mmol/L 3.4(L) 3.0(L) 3.0(L)  Chloride 98 - 111 mmol/L 106 108 106  CO2 22 - 32 mmol/L 26 28 27   Calcium 8.9 - 10.3 mg/dL 8.2(L) 8.0(L) 8.3(L)  Total Protein 6.5 - 8.1 g/dL - - -  Total Bilirubin 0.3 - 1.2 mg/dL - - -  Alkaline Phos 38 - 126 U/L - - -  AST 15 - 41 U/L - - -  ALT 0 - 44 U/L - - -     Imaging studies: No new pertinent imaging studies   Assessment/Plan:  68 y.o. female with improved hypokalemia but worsening leukocytosis this morning and some right sided chest discomfort otherwise clinically stable 8 Days Post-Op s/p exploratory laparotomy and small bowel resectionfor small bowel perforation and 9 days s/p laparoscopic cholecystectomy for acute cholecystis   - Full liquid diet; advance if tolerates and nausea remains resolved  - Continue IV ABx (Zosyn) - pain control prn; antiemetics prn - monitor abdominal examination; on-going bowel function; JP output    - Will get CXR to evaluate for possible recurrence of pleural effusion  - Replete K+ and phosphorus; monitor; appreciate pharmacy assistance - Monitor leukocytosis; renal function; will check UA  - Wean to RA if possible; encouraged IS - Mobilization encouraged; PT engaged; recommending home health PT + rolling walker - Medical management of comorbidities - DVT prophylaxis  All of the above findings and recommendations were discussed with the patient, and the medical team, and all of patient's questions were answered to her expressed satisfaction.  -- Edison Simon, PA-C [facility] 10/26/2018, 8:08 AM [phone] M-F: 7am - 4pm

## 2018-10-26 NOTE — Progress Notes (Addendum)
Brief Progress Note Spoke with patient's RN earlier in day after their phone call with patient's husband who was requesting transfer to [facility]. Dr Hampton Abbot spoke with the patient's husband via phone who again confirmed request for transfer to [facility]. This process was initiated this afternoon, awaiting call back.   Additionally, patient went for US Guided Thoracentesis with IR this afternoon for recurrent right pleural effusion. I spoke with Dr Pascal Lux, MD who appreciated loculated/septated fluid collections on [place], which were new compared to 07/06, and felt thoracentesis would not be beneficial given the loculations, which I agree with. Instead, it was agreed upon to consider CT evaluation of the chest and discuss with Dr Genevive Bi as patient may require further intervention, such as VATS. Dr Genevive Bi was consulted by Dr Hampton Abbot and he will evaluate the patient prior to proceeding with additional imaging at this time.   Will continue to update regarding possible transfer and further POC.   -- Edison Simon, PA-C [facility] 10/26/2018, 2:32 PM [phone] M-F: 7am - 4pm

## 2018-10-27 ENCOUNTER — Ambulatory Visit: Payer: Self-pay | Admitting: Urology

## 2018-10-28 LAB — BODY FLUID CULTURE: Culture: NO GROWTH

## 2018-10-29 LAB — ANAEROBIC CULTURE: Special Requests: NORMAL

## 2018-10-31 ENCOUNTER — Ambulatory Visit: Payer: Self-pay | Admitting: Urology

## 2018-10-31 MED ORDER — HEPARIN SODIUM (PORCINE) 5000 UNIT/ML IJ SOLN
5000.00 | INTRAMUSCULAR | Status: DC
Start: 2018-10-31 — End: 2018-10-31

## 2018-10-31 MED ORDER — METRONIDAZOLE IN NACL 5-0.79 MG/ML-% IV SOLN
500.00 | INTRAVENOUS | Status: DC
Start: 2018-10-31 — End: 2018-10-31

## 2018-10-31 MED ORDER — GENERIC EXTERNAL MEDICATION
1750.00 | Status: DC
Start: 2018-10-31 — End: 2018-10-31

## 2018-10-31 MED ORDER — ACETAMINOPHEN 500 MG PO TABS
1000.00 | ORAL_TABLET | ORAL | Status: DC
Start: 2018-10-31 — End: 2018-10-31

## 2018-10-31 MED ORDER — ALBUTEROL SULFATE (2.5 MG/3ML) 0.083% IN NEBU
2.50 | INHALATION_SOLUTION | RESPIRATORY_TRACT | Status: DC
Start: ? — End: 2018-10-31

## 2018-10-31 MED ORDER — CEFEPIME HCL 2 GM/100ML IV SOLN
2.00 | INTRAVENOUS | Status: DC
Start: 2018-10-31 — End: 2018-10-31

## 2018-10-31 MED ORDER — DEXTROSE 50 % IV SOLN
12.50 | INTRAVENOUS | Status: DC
Start: ? — End: 2018-10-31

## 2018-10-31 MED ORDER — BACITRACIN 500 UNIT/GM EX OINT
TOPICAL_OINTMENT | CUTANEOUS | Status: DC
Start: 2018-11-01 — End: 2018-10-31

## 2018-10-31 MED ORDER — LACTATED RINGERS IV SOLN
10.00 | INTRAVENOUS | Status: DC
Start: ? — End: 2018-10-31

## 2018-11-08 ENCOUNTER — Other Ambulatory Visit: Payer: Medicare HMO

## 2018-11-08 ENCOUNTER — Other Ambulatory Visit: Payer: Self-pay | Admitting: Radiology

## 2018-11-08 ENCOUNTER — Encounter: Payer: Self-pay | Admitting: Urology

## 2018-11-08 ENCOUNTER — Telehealth: Payer: Self-pay | Admitting: Urology

## 2018-11-08 ENCOUNTER — Ambulatory Visit (INDEPENDENT_AMBULATORY_CARE_PROVIDER_SITE_OTHER): Payer: Medicare HMO | Admitting: Urology

## 2018-11-08 ENCOUNTER — Other Ambulatory Visit: Payer: Self-pay

## 2018-11-08 VITALS — BP 132/83 | HR 103

## 2018-11-08 DIAGNOSIS — N132 Hydronephrosis with renal and ureteral calculous obstruction: Secondary | ICD-10-CM | POA: Diagnosis not present

## 2018-11-08 DIAGNOSIS — N201 Calculus of ureter: Secondary | ICD-10-CM | POA: Diagnosis not present

## 2018-11-08 DIAGNOSIS — N200 Calculus of kidney: Secondary | ICD-10-CM

## 2018-11-08 LAB — URINALYSIS, COMPLETE
Bilirubin, UA: NEGATIVE
Glucose, UA: NEGATIVE
Ketones, UA: NEGATIVE
Nitrite, UA: NEGATIVE
Specific Gravity, UA: 1.02 (ref 1.005–1.030)
Urobilinogen, Ur: 0.2 mg/dL (ref 0.2–1.0)
pH, UA: 6.5 (ref 5.0–7.5)

## 2018-11-08 LAB — MICROSCOPIC EXAMINATION
RBC: NONE SEEN /hpf (ref 0–2)
WBC, UA: >30 /hpf — AB (ref 0–5)

## 2018-11-08 NOTE — Progress Notes (Signed)
11/08/2018 11:52 AM   Holly Dickson 11/03/1950 [id number]  Referring provider: Evelene CroonNiemeyer, Meindert, MD [address] [place],  [place] [id number]  Chief Complaint  Patient presents with  . Nephrolithiasis    HPI: Holly Dickson is a 68 year old female who [facility] ED on 10/15/2018 with severe epigastric abdominal pain secondary to acute cholecystitis.  She was incidentally noted to have a 9 x 12 mm right proximal ureteral calculus with severe hydronephrosis and significant renal atrophy.  Since she was undergoing anesthesia for laparoscopic cholecystectomy placement of a right ureteral stent was recommended.  She also has a nonobstructing 14 mm left lower pole calculus.  She developed low-grade fever on 6/29 and a CT on 6/30 showed oral contrast extravasation from the small bowel.  She underwent exploratory laparotomy with small bowel resection due to perforation on 7/1.  [facility] was [facility] 7[facility]8 and subsequently discharged on 7/16.  She is tolerating her stent well and has no bothersome lower urinary tract symptoms.  PMH: Past Medical History:  Diagnosis Date  . Hypertension     Surgical History: Past Surgical History:  Procedure Laterality Date  . BOWEL RESECTION N/A 10/18/2018   Procedure: SMALL BOWEL RESECTION;  Surgeon: Ancil Linseyavis, Jason Evan, MD;  Location: [facility] ORS;  Service: General;  Laterality: N/A;  . CHOLECYSTECTOMY N/A 10/15/2018   Procedure: LAPAROSCOPIC CHOLECYSTECTOMY with umbilical hernia repair;  Surgeon: Henrene DodgePiscoya, Jose, MD;  Location: [facility] ORS;  Service: General;  Laterality: N/A;  . CYSTOSCOPY WITH STENT PLACEMENT Right 10/15/2018   Procedure: CYSTOSCOPY WITH STENT PLACEMENT,right retrograde pyelogram;  Surgeon: Crist FatHerrick, Benjamin W, MD;  Location: [facility] ORS;  Service: Urology;  Laterality: Right;  . LAPAROTOMY N/A 10/18/2018   Procedure: EXPLORATORY LAPAROTOMY;  Surgeon: Ancil Linseyavis, Jason Evan, MD;  Location: [facility] ORS;  Service: General;   Laterality: N/A;    Home Medications:  Allergies as of 11/08/2018   No Known Allergies     Medication List       Accurate as of November 08, 2018 11:52 AM. If you have any questions, ask your nurse or doctor.        acetaminophen 500 MG tablet Commonly known as: TYLENOL Take 2 tablets (1,000 mg total) by mouth every 6 (six) hours as needed for mild pain or fever.   amoxicillin-clavulanate 500-125 MG tablet Commonly known as: AUGMENTIN TAKE 1 TABLET BY MOUTH TWICE DAILY FOR 9 DAYS   aspirin 81 MG chewable tablet Chew by mouth.   dextrose 5 % and 0.45 % NaCl with KCl 20 mEq/L 20-5-0.45 MEQ/L-%-% Inject 75 mL/hr into the vein continuous.   escitalopram 20 MG tablet Commonly known as: LEXAPRO Take 20 mg by mouth daily.   feeding supplement (ENSURE ENLIVE) Liqd Take 237 mLs by mouth 2 (two) times daily between meals.   heparin 5000 UNIT/ML injection Inject 1 mL (5,000 Units total) into the skin every 8 (eight) hours.   ipratropium-albuterol 0.5-2.5 (3) MG/3ML Soln Commonly known as: DUONEB Take 3 mLs by nebulization every 4 (four) hours as needed.   lisinopril 20 MG tablet Commonly known as: ZESTRIL   lisinopril-hydrochlorothiazide 20-25 MG tablet Commonly known as: ZESTORETIC Take 1 tablet by mouth daily.   loperamide 2 MG capsule Commonly known as: IMODIUM Take 1 capsule (2 mg total) by mouth 2 (two) times a day.   morphine 2 MG/ML injection Inject 0.5-1 mLs (1-2 mg total) into the vein every 4 (four) hours as needed.   mouth rinse Liqd solution 15 mLs  by Mouth Rinse route 2 (two) times daily.   ondansetron 4 MG disintegrating tablet Commonly known as: ZOFRAN-ODT Take 1 tablet (4 mg total) by mouth every 6 (six) hours as needed for nausea.   ondansetron 4 MG/2ML Soln injection Commonly known as: ZOFRAN Inject 2 mLs (4 mg total) into the vein every 6 (six) hours as needed for nausea.   oxyCODONE 5 MG immediate release tablet Commonly known as: Oxy  IR/ROXICODONE Take 1-2 tablets (5-10 mg total) by mouth every 4 (four) hours as needed for moderate pain or severe pain.   pantoprazole 40 MG injection Commonly known as: PROTONIX Inject 40 mg into the vein at bedtime.   phenazopyridine 200 MG tablet Commonly known as: Pyridium Take 1 tablet (200 mg total) by mouth 3 (three) times daily as needed for pain.   piperacillin-tazobactam 3.375 GM/50ML IVPB Commonly known as: ZOSYN Inject 50 mLs (3.375 g total) into the vein every 8 (eight) hours.   promethazine 25 MG/ML injection Commonly known as: PHENERGAN Inject 0.5 mLs (12.5 mg total) into the vein every 6 (six) hours as needed for nausea or vomiting.   psyllium 95 % Pack Commonly known as: HYDROCIL/METAMUCIL Take 1 packet by mouth 2 (two) times daily.   tamsulosin 0.4 MG Caps capsule Commonly known as: FLOMAX Take 1 capsule (0.4 mg total) by mouth daily.       Allergies: No Known Allergies  Family History: No family history on file.  Social History:  reports that she has never smoked. She has never used smokeless tobacco. She reports previous alcohol use. She reports that she does not use drugs.  ROS: UROLOGY Frequent Urination?: No Hard to postpone urination?: No Burning/pain with urination?: No Get up at night to urinate?: No Leakage of urine?: No Urine stream starts and stops?: No Trouble starting stream?: No Do you have to strain to urinate?: No Blood in urine?: No Urinary tract infection?: No Sexually transmitted disease?: No Injury to kidneys or bladder?: No Painful intercourse?: No Weak stream?: No Currently pregnant?: No Vaginal bleeding?: No Last menstrual period?: N/A  Gastrointestinal Nausea?: No Vomiting?: No Indigestion/heartburn?: No Diarrhea?: No Constipation?: No  Constitutional Fever: No Night sweats?: No Weight loss?: No Fatigue?: No  Skin Skin rash/lesions?: No Itching?: No  Eyes Blurred vision?: No Double vision?: No   Ears/Nose/Throat Sore throat?: No Sinus problems?: No  Hematologic/Lymphatic Swollen glands?: No Easy bruising?: No  Cardiovascular Leg swelling?: No Chest pain?: No  Respiratory Cough?: No Shortness of breath?: No  Endocrine Excessive thirst?: No  Musculoskeletal Back pain?: No Joint pain?: No  Neurological Headaches?: No Dizziness?: No  Psychologic Depression?: No Anxiety?: No  Physical Exam: BP 132/83 (BP Location: Left Arm, Patient Position: Sitting, Cuff Size: Normal)   Pulse (!) 103   Constitutional:  Alert and oriented, No acute distress. HEENT:  AT, moist mucus membranes.  Trachea midline, no masses. Cardiovascular: No clubbing, cyanosis, or edema.  RRR Respiratory: Normal respiratory effort, no increased work of breathing.  Clear GI: Abdomen is soft, nontender, nondistended, no abdominal masses GU: No CVA tenderness Lymph: No cervical or inguinal lymphadenopathy. Skin: No rashes, bruises or suspicious lesions. Neurologic: Grossly intact, no focal deficits, moving all 4 extremities. Psychiatric: Normal mood and affect.   Assessment & Plan:   CT was personally reviewed.  She has severe right hydronephrosis and minimal renal parenchyma indicating long-term obstruction from this calculus.  Will schedule right ureteroscopy with laser lithotripsy/stone removal.  We will plan a renal scan postoperatively as  it is unlikely this kidney has significant function.  The indications and nature of the planned procedure were discussed as well as the potential  benefits and expected outcome.  Alternatives have been discussed in detail. The most common complications and side effects were discussed including but not limited to infection/sepsis; blood loss; damage to urethra, bladder, ureter; need for multiple surgeries; need for prolonged stent placement as well as general anesthesia risks.  All of her questions were answered and she desires to proceed.    She has  elected observation for her nonobstructing left lower pole calculus for now   Riki AltesScott C Laverne Klugh, MD  [facility] [address], [address] [place], [place] [address] [phone]

## 2018-11-08 NOTE — Telephone Encounter (Signed)
Patient was given the [facility] Information form below as well as the Instructions for Pre-Admission Testing form & a map of [facility].    [place], [place], [place] [address] Telephone: [phone] Fax: [phone]   Thank you for choosing [facility] for your upcoming surgery!  We are always here to assist in your urological needs.  Please read the following information with specific details for your upcoming appointments related to your surgery. Please contact Leah at [phone] with any questions.  The Name of Your Surgery: Right Ureteroscopy,laser lithotripsy,stone removal and stent exchange Your Surgery Date: 11/16/18 Your Surgeon: John Giovanni  Please call Same Day Surgery at [phone] between the hours of 1pm-3pm one day prior to your surgery. They will inform you of the time to arrive at Same Day Surgery which is located on the second floor of the [facility].   Please refer to the attached letter regarding instructions for Pre-Admission Testing. You will receive a call from the [place] office regarding your appointment with them.  The Pre-Admission Testing office is located at [place], on the first floor of the [facility] in [place] (office is to the right as you enter through the [employer] of the [facility]). Please have all medications you are currently taking and your insurance card available.  A COVID-19 test will be required prior to surgery and once test is performed you will need to remain in quarantine until the day of surgery. Patient was advised to have nothing to eat or drink after midnight the night prior to surgery except that she may have only water until 2 hours before surgery with nothing to drink within 2 hours of surgery.  The patient states she currently  takes Aspirin 81 mg occasionally & was informed to hold all anticoagulants for 7 days prior to surgery beginning on 11/09/18. Patient's questions were answered and she expressed understanding of these instructions.

## 2018-11-11 ENCOUNTER — Other Ambulatory Visit: Payer: Medicare HMO

## 2018-11-11 LAB — CULTURE, URINE COMPREHENSIVE

## 2018-11-16 ENCOUNTER — Ambulatory Visit: Admit: 2018-11-16 | Payer: Medicare HMO | Admitting: Urology

## 2018-11-16 SURGERY — CYSTOSCOPY/URETEROSCOPY/HOLMIUM LASER/STENT PLACEMENT
Anesthesia: Choice | Laterality: Right

## 2019-01-26 ENCOUNTER — Encounter: Payer: Self-pay | Admitting: Urology

## 2019-01-27 NOTE — Telephone Encounter (Signed)
Spoke with patient and explained that I could only give her records from our doctor's that saw her and treated her and that anything else she would need to contact the medical records department at the hospital. Printed her records and left them upfront for her to pick up.   Holly Dickson

## 2019-06-24 DIAGNOSIS — L219 Seborrheic dermatitis, unspecified: Secondary | ICD-10-CM | POA: Insufficient documentation

## 2019-06-24 DIAGNOSIS — B009 Herpesviral infection, unspecified: Secondary | ICD-10-CM | POA: Insufficient documentation

## 2019-07-12 ENCOUNTER — Ambulatory Visit: Payer: Medicare HMO | Admitting: Dermatology

## 2020-11-13 ENCOUNTER — Other Ambulatory Visit: Payer: Self-pay

## 2020-11-13 ENCOUNTER — Ambulatory Visit: Payer: Medicare HMO | Admitting: Urology

## 2020-11-13 ENCOUNTER — Encounter: Payer: Self-pay | Admitting: Urology

## 2020-11-13 VITALS — BP 144/88 | HR 1 | Ht 63.0 in | Wt 152.0 lb

## 2020-11-13 DIAGNOSIS — N200 Calculus of kidney: Secondary | ICD-10-CM | POA: Diagnosis not present

## 2020-11-13 NOTE — Progress Notes (Signed)
11/13/2020 1:08 PM   Holly Dickson 1950-07-19 [id number]  Referring provider: Danella Penton, MD [phone] [facility] [address] [facility] [place],  [place] [address]  Chief Complaint  Patient presents with   Other    HPI: Holly Dickson is a 70 y.o. female presents for evaluation of a left ureteral calculus.  Presented to [facility] ED with left flank pain and found to have an obstructing 14 mm left proximal ureteral calculus with pyelonephritis/sepsis Underwent urgent stent placement by urology and developed septic shock requiring fluid boluses and norepinephrine Also developed acute hypoxic respiratory failure treated with 8 L nasal cannula and ATN Blood cultures grew pansensitive E. coli and she was discharged 10/25/2020 on oral Cipro Recent follow-up with 11/04/2020 Dr. Hyacinth Meeker and stated she had had difficulty getting scheduled for follow-up with [facility] There is a note in Epic on 10/30/2020 that window for ureteroscopic stone removal was undetermined but she was to be tentatively scheduled for a mini PCNL by Dr. Dow Adolph however she states she had not heard anything and was referred here by Dr. Hyacinth Meeker.  Was contacted by [facility] on 7/21 and she had already had an appointment scheduled for here Urine culture 11/04/2020 grew mixed flora; creatinine 1.5 Right ureteroscopy with laser lithotripsy/stone extraction of a 9 mm right ureteral calculus at [facility] 12/28/2018   PMH: Past Medical History:  Diagnosis Date   Hypertension     Surgical History: Past Surgical History:  Procedure Laterality Date   BOWEL RESECTION N/A 10/18/2018   Procedure: SMALL BOWEL RESECTION;  Surgeon: Ancil Linsey, MD;  Location: [facility] ORS;  Service: General;  Laterality: N/A;   CHOLECYSTECTOMY N/A 10/15/2018   Procedure: LAPAROSCOPIC CHOLECYSTECTOMY with umbilical hernia repair;  Surgeon: Henrene Dodge, MD;  Location: [facility] ORS;  Service: General;  Laterality: N/A;   CYSTOSCOPY  WITH STENT PLACEMENT Right 10/15/2018   Procedure: CYSTOSCOPY WITH STENT PLACEMENT,right retrograde pyelogram;  Surgeon: Crist Fat, MD;  Location: [facility] ORS;  Service: Urology;  Laterality: Right;   LAPAROTOMY N/A 10/18/2018   Procedure: EXPLORATORY LAPAROTOMY;  Surgeon: Ancil Linsey, MD;  Location: [facility] ORS;  Service: General;  Laterality: N/A;    Home Medications:  Allergies as of 11/13/2020       Reactions   Levofloxacin Nausea Only   Chest pain , headache Chest pain , headache        Medication List        Accurate as of November 13, 2020  1:08 PM. If you have any questions, ask your nurse or doctor.          STOP taking these medications    dextrose 5 % and 0.45 % NaCl with KCl 20 mEq/L 20-5-0.45 MEQ/L-%-% Stopped by: Riki Altes, MD   feeding supplement Liqd Stopped by: Riki Altes, MD   heparin 5000 UNIT/ML injection Stopped by: Riki Altes, MD   hydrocortisone 2.5 % cream Stopped by: Riki Altes, MD   ipratropium-albuterol 0.5-2.5 (3) MG/3ML Soln Commonly known as: DUONEB Stopped by: Riki Altes, MD   lisinopril 20 MG tablet Commonly known as: ZESTRIL Stopped by: Riki Altes, MD   lisinopril-hydrochlorothiazide 20-25 MG tablet Commonly known as: ZESTORETIC Stopped by: Riki Altes, MD   loperamide 2 MG capsule Commonly known as: IMODIUM Stopped by: Riki Altes, MD   morphine 2 MG/ML injection Stopped by: Riki Altes, MD   mouth rinse Liqd solution Stopped by: Riki Altes, MD  ondansetron 4 MG disintegrating tablet Commonly known as: ZOFRAN-ODT Stopped by: Riki Altes, MD   ondansetron 4 MG/2ML Soln injection Commonly known as: ZOFRAN Stopped by: Riki Altes, MD   oxyCODONE 5 MG immediate release tablet Commonly known as: Oxy IR/ROXICODONE Stopped by: Riki Altes, MD   pantoprazole 40 MG injection Commonly known as: PROTONIX Stopped by: Riki Altes, MD   phenazopyridine  200 MG tablet Commonly known as: Pyridium Stopped by: Riki Altes, MD   piperacillin-tazobactam 3.375 GM/50ML IVPB Commonly known as: ZOSYN Stopped by: Riki Altes, MD   promethazine 25 MG/ML injection Commonly known as: PHENERGAN Stopped by: Riki Altes, MD   psyllium 95 % Pack Commonly known as: HYDROCIL/METAMUCIL Stopped by: Riki Altes, MD   tamsulosin 0.4 MG Caps capsule Commonly known as: FLOMAX Stopped by: Riki Altes, MD       TAKE these medications    acetaminophen 500 MG tablet Commonly known as: TYLENOL Take 2 tablets (1,000 mg total) by mouth every 6 (six) hours as needed for mild pain or fever.   acyclovir ointment 5 % Commonly known as: ZOVIRAX Apply 1 application topically 5 (five) times daily as needed.   amLODipine 5 MG tablet Commonly known as: NORVASC Take by mouth.   amoxicillin-clavulanate 500-125 MG tablet Commonly known as: AUGMENTIN TAKE 1 TABLET BY MOUTH TWICE DAILY FOR 9 DAYS   escitalopram 20 MG tablet Commonly known as: LEXAPRO Take 20 mg by mouth daily.   ketoconazole 2 % cream Commonly known as: NIZORAL Apply 1 application topically 3 (three) times a week.   losartan 50 MG tablet Commonly known as: COZAAR Take 50 mg by mouth daily.        Allergies:  Allergies  Allergen Reactions   Levofloxacin Nausea Only    Chest pain , headache Chest pain , headache     Family History: History reviewed. No pertinent family history.  Social History:  reports that she has never smoked. She has never used smokeless tobacco. She reports previous alcohol use. She reports that she does not use drugs.   Physical Exam: BP (!) 144/88   Pulse (!) 1   Ht 5\' 3"  (1.6 m)   Wt 152 lb (68.9 kg)   BMI 26.93 kg/m   Constitutional:  Alert and oriented, No acute distress. HEENT: Overly AT, moist mucus membranes.  Trachea midline, no masses. Cardiovascular: No clubbing, cyanosis, or edema. Respiratory: Normal respiratory  effort, no increased work of breathing. GI: Abdomen is soft, nontender, nondistended, no abdominal masses GU: No CVA tenderness Lymph: No cervical or inguinal lymphadenopathy. Skin: No rashes, bruises or suspicious lesions. Neurologic: Grossly intact, no focal deficits, moving all 4 extremities. Psychiatric: Normal mood and affect.   Pertinent Imaging: CT images 10/18/2020 were personally reviewed and interpreted; ~ 9 x 16 mm left UPJ calculus   Assessment & Plan:    1.  Left UPJ calculus She has requested to have her stone treated locally We discussed various treatment options for urolithiasis including  shockwave lithotripsy (SWL), ureteroscopy and laser lithotripsy with stent placement, and percutaneous nephrolithotomy. We discussed that management is based on stone size, location, density, patient co-morbidities, and patient preference.  SWL has a lower stone free rate in a single procedure, but also a lower complication rate compared to ureteroscopy and avoids a stent and associated stent related symptoms. Possible complications include renal hematoma, steinstrasse, and need for additional treatment. Ureteroscopy with laser lithotripsy and stent placement has  a higher stone free rate than SWL in a single procedure, however increased complication rate including possible infection, ureteral injury, bleeding, and stent related morbidity. Common stent related symptoms include dysuria, urgency/frequency, and flank pain. PCNL is the favored treatment for stones >2cm. It involves a small incision in the flank, with complete fragmentation of stones and removal. It has the highest stone free rate, but also the highest complication rate. Possible complications include bleeding, infection/sepsis, injury to surrounding organs including the pleura, and collecting system injury.  We discussed that none of our providers here perform mini PCNL After an extensive discussion of the risks and benefits of the  above treatment options, the patient would like to proceed with ureteroscopic removal.  Although the goal is entire stone removal the possibility of a staged procedure was discussed.  All questions were answered and she desires to schedule Urine culture ordered   Riki Altes, MD  [facility] [address], [address] [place], [place] [address] [phone]

## 2020-11-13 NOTE — H&P (View-Only) (Signed)
11/13/2020 1:08 PM   Holly Dickson 1950-07-19 [id number]  Referring provider: Danella Penton, MD [id number] [facility] [address] [facility] [place],  [place] [address]  Chief Complaint  Patient presents with   Other    HPI: Holly Dickson is a 70 y.o. female presents for evaluation of a left ureteral calculus.  Presented to [facility] ED with left flank pain and found to have an obstructing 14 mm left proximal ureteral calculus with pyelonephritis/sepsis Underwent urgent stent placement by urology and developed septic shock requiring fluid boluses and norepinephrine Also developed acute hypoxic respiratory failure treated with 8 L nasal cannula and ATN Blood cultures grew pansensitive E. coli and she was discharged 10/25/2020 on oral Cipro Recent follow-up with 11/04/2020 Dr. Hyacinth Meeker and stated she had had difficulty getting scheduled for follow-up with [facility] There is a note in Epic on 10/30/2020 that window for ureteroscopic stone removal was undetermined but she was to be tentatively scheduled for a mini PCNL by Dr. Dow Adolph however she states she had not heard anything and was referred here by Dr. Hyacinth Meeker.  Was contacted by [facility] on 7/21 and she had already had an appointment scheduled for here Urine culture 11/04/2020 grew mixed flora; creatinine 1.5 Right ureteroscopy with laser lithotripsy/stone extraction of a 9 mm right ureteral calculus at [facility] 12/28/2018   PMH: Past Medical History:  Diagnosis Date   Hypertension     Surgical History: Past Surgical History:  Procedure Laterality Date   BOWEL RESECTION N/A 10/18/2018   Procedure: SMALL BOWEL RESECTION;  Surgeon: Ancil Linsey, MD;  Location: [facility] ORS;  Service: General;  Laterality: N/A;   CHOLECYSTECTOMY N/A 10/15/2018   Procedure: LAPAROSCOPIC CHOLECYSTECTOMY with umbilical hernia repair;  Surgeon: Henrene Dodge, MD;  Location: [facility] ORS;  Service: General;  Laterality: N/A;   CYSTOSCOPY  WITH STENT PLACEMENT Right 10/15/2018   Procedure: CYSTOSCOPY WITH STENT PLACEMENT,right retrograde pyelogram;  Surgeon: Crist Fat, MD;  Location: [facility] ORS;  Service: Urology;  Laterality: Right;   LAPAROTOMY N/A 10/18/2018   Procedure: EXPLORATORY LAPAROTOMY;  Surgeon: Ancil Linsey, MD;  Location: [facility] ORS;  Service: General;  Laterality: N/A;    Home Medications:  Allergies as of 11/13/2020       Reactions   Levofloxacin Nausea Only   Chest pain , headache Chest pain , headache        Medication List        Accurate as of November 13, 2020  1:08 PM. If you have any questions, ask your nurse or doctor.          STOP taking these medications    dextrose 5 % and 0.45 % NaCl with KCl 20 mEq/L 20-5-0.45 MEQ/L-%-% Stopped by: Riki Altes, MD   feeding supplement Liqd Stopped by: Riki Altes, MD   heparin 5000 UNIT/ML injection Stopped by: Riki Altes, MD   hydrocortisone 2.5 % cream Stopped by: Riki Altes, MD   ipratropium-albuterol 0.5-2.5 (3) MG/3ML Soln Commonly known as: DUONEB Stopped by: Riki Altes, MD   lisinopril 20 MG tablet Commonly known as: ZESTRIL Stopped by: Riki Altes, MD   lisinopril-hydrochlorothiazide 20-25 MG tablet Commonly known as: ZESTORETIC Stopped by: Riki Altes, MD   loperamide 2 MG capsule Commonly known as: IMODIUM Stopped by: Riki Altes, MD   morphine 2 MG/ML injection Stopped by: Riki Altes, MD   mouth rinse Liqd solution Stopped by: Riki Altes, MD  ondansetron 4 MG disintegrating tablet Commonly known as: ZOFRAN-ODT Stopped by: Riki Altes, MD   ondansetron 4 MG/2ML Soln injection Commonly known as: ZOFRAN Stopped by: Riki Altes, MD   oxyCODONE 5 MG immediate release tablet Commonly known as: Oxy IR/ROXICODONE Stopped by: Riki Altes, MD   pantoprazole 40 MG injection Commonly known as: PROTONIX Stopped by: Riki Altes, MD   phenazopyridine  200 MG tablet Commonly known as: Pyridium Stopped by: Riki Altes, MD   piperacillin-tazobactam 3.375 GM/50ML IVPB Commonly known as: ZOSYN Stopped by: Riki Altes, MD   promethazine 25 MG/ML injection Commonly known as: PHENERGAN Stopped by: Riki Altes, MD   psyllium 95 % Pack Commonly known as: HYDROCIL/METAMUCIL Stopped by: Riki Altes, MD   tamsulosin 0.4 MG Caps capsule Commonly known as: FLOMAX Stopped by: Riki Altes, MD       TAKE these medications    acetaminophen 500 MG tablet Commonly known as: TYLENOL Take 2 tablets (1,000 mg total) by mouth every 6 (six) hours as needed for mild pain or fever.   acyclovir ointment 5 % Commonly known as: ZOVIRAX Apply 1 application topically 5 (five) times daily as needed.   amLODipine 5 MG tablet Commonly known as: NORVASC Take by mouth.   amoxicillin-clavulanate 500-125 MG tablet Commonly known as: AUGMENTIN TAKE 1 TABLET BY MOUTH TWICE DAILY FOR 9 DAYS   escitalopram 20 MG tablet Commonly known as: LEXAPRO Take 20 mg by mouth daily.   ketoconazole 2 % cream Commonly known as: NIZORAL Apply 1 application topically 3 (three) times a week.   losartan 50 MG tablet Commonly known as: COZAAR Take 50 mg by mouth daily.        Allergies:  Allergies  Allergen Reactions   Levofloxacin Nausea Only    Chest pain , headache Chest pain , headache     Family History: History reviewed. No pertinent family history.  Social History:  reports that she has never smoked. She has never used smokeless tobacco. She reports previous alcohol use. She reports that she does not use drugs.   Physical Exam: BP (!) 144/88   Pulse (!) 1   Ht 5\' 3"  (1.6 m)   Wt 152 lb (68.9 kg)   BMI 26.93 kg/m   Constitutional:  Alert and oriented, No acute distress. HEENT: Overly AT, moist mucus membranes.  Trachea midline, no masses. Cardiovascular: No clubbing, cyanosis, or edema. Respiratory: Normal respiratory  effort, no increased work of breathing. GI: Abdomen is soft, nontender, nondistended, no abdominal masses GU: No CVA tenderness Lymph: No cervical or inguinal lymphadenopathy. Skin: No rashes, bruises or suspicious lesions. Neurologic: Grossly intact, no focal deficits, moving all 4 extremities. Psychiatric: Normal mood and affect.   Pertinent Imaging: CT images 10/18/2020 were personally reviewed and interpreted; ~ 9 x 16 mm left UPJ calculus   Assessment & Plan:    1.  Left UPJ calculus She has requested to have her stone treated locally We discussed various treatment options for urolithiasis including  shockwave lithotripsy (SWL), ureteroscopy and laser lithotripsy with stent placement, and percutaneous nephrolithotomy. We discussed that management is based on stone size, location, density, patient co-morbidities, and patient preference.  SWL has a lower stone free rate in a single procedure, but also a lower complication rate compared to ureteroscopy and avoids a stent and associated stent related symptoms. Possible complications include renal hematoma, steinstrasse, and need for additional treatment. Ureteroscopy with laser lithotripsy and stent placement has  a higher stone free rate than SWL in a single procedure, however increased complication rate including possible infection, ureteral injury, bleeding, and stent related morbidity. Common stent related symptoms include dysuria, urgency/frequency, and flank pain. PCNL is the favored treatment for stones >2cm. It involves a small incision in the flank, with complete fragmentation of stones and removal. It has the highest stone free rate, but also the highest complication rate. Possible complications include bleeding, infection/sepsis, injury to surrounding organs including the pleura, and collecting system injury.  We discussed that none of our providers here perform mini PCNL After an extensive discussion of the risks and benefits of the  above treatment options, the patient would like to proceed with ureteroscopic removal.  Although the goal is entire stone removal the possibility of a staged procedure was discussed.  All questions were answered and she desires to schedule Urine culture ordered   Riki Altes, MD  [facility] [address], [address] [place], [place] [address] [phone]

## 2020-11-15 ENCOUNTER — Other Ambulatory Visit: Payer: Self-pay

## 2020-11-15 DIAGNOSIS — N200 Calculus of kidney: Secondary | ICD-10-CM

## 2020-11-17 ENCOUNTER — Other Ambulatory Visit: Payer: Self-pay | Admitting: Urology

## 2020-11-17 ENCOUNTER — Encounter: Payer: Self-pay | Admitting: Urology

## 2020-11-17 LAB — CULTURE, URINE COMPREHENSIVE

## 2020-11-17 MED ORDER — FLUCONAZOLE 150 MG PO TABS: 150.0000 mg | ORAL_TABLET | Freq: Every day | ORAL | 0 refills | Status: AC

## 2020-11-18 ENCOUNTER — Telehealth: Payer: Self-pay | Admitting: *Deleted

## 2020-11-18 MED ORDER — ORAL CARE MOUTH RINSE: 15.0000 mL | Freq: Once | OROMUCOSAL | Status: AC

## 2020-11-18 MED ORDER — LACTATED RINGERS IV SOLN: INTRAVENOUS | Status: DC

## 2020-11-18 MED ORDER — CHLORHEXIDINE GLUCONATE 0.12 % MT SOLN: 15.0000 mL | Freq: Once | OROMUCOSAL | Status: AC

## 2020-11-18 MED ORDER — GENTAMICIN SULFATE 40 MG/ML IJ SOLN
5.0000 mg/kg | INTRAVENOUS | Status: AC
  Administered 2020-11-19: 340 mg via INTRAVENOUS
  Filled 2020-11-18: qty 8.5

## 2020-11-18 NOTE — Telephone Encounter (Signed)
-----   Message from Riki Altes, MD sent at 11/17/2020  9:52 AM EDT ----- Urine culture growing a small amount of yeast most likely representing colonization.  I did recommend treatment since she is having surgery this week.  I sent her a MyChart message and Rx Diflucan has been sent in.  Please make sure she got the message and picked up the medication

## 2020-11-18 NOTE — Telephone Encounter (Signed)
Notified patient as instructed, patient pleased °

## 2020-11-19 ENCOUNTER — Ambulatory Visit
Admission: RE | Admit: 2020-11-19 | Discharge: 2020-11-19 | Disposition: A | Discharge status: Home / Self Care | Payer: Medicare HMO | Attending: Urology | Admitting: Urology

## 2020-11-19 ENCOUNTER — Ambulatory Visit: Payer: Medicare HMO

## 2020-11-19 ENCOUNTER — Encounter: Payer: Self-pay | Admitting: Urology

## 2020-11-19 ENCOUNTER — Ambulatory Visit: Payer: Medicare HMO | Admitting: Certified Registered Nurse Anesthetist

## 2020-11-19 ENCOUNTER — Other Ambulatory Visit: Payer: Self-pay

## 2020-11-19 ENCOUNTER — Telehealth: Payer: Self-pay | Admitting: *Deleted

## 2020-11-19 ENCOUNTER — Encounter
Admission: RE | Disposition: A | Discharge status: Home / Self Care | Payer: Self-pay | Source: Home / Self Care | Attending: Urology

## 2020-11-19 DIAGNOSIS — N200 Calculus of kidney: Secondary | ICD-10-CM

## 2020-11-19 DIAGNOSIS — Z9049 Acquired absence of other specified parts of digestive tract: Secondary | ICD-10-CM | POA: Insufficient documentation

## 2020-11-19 DIAGNOSIS — Z881 Allergy status to other antibiotic agents status: Secondary | ICD-10-CM | POA: Insufficient documentation

## 2020-11-19 DIAGNOSIS — I10 Essential (primary) hypertension: Secondary | ICD-10-CM | POA: Diagnosis not present

## 2020-11-19 HISTORY — PX: CYSTOSCOPY W/ RETROGRADES: SHX1426

## 2020-11-19 HISTORY — PX: CYSTOSCOPY/URETEROSCOPY/HOLMIUM LASER/STENT PLACEMENT: SHX6546

## 2020-11-19 SURGERY — CYSTOSCOPY/URETEROSCOPY/HOLMIUM LASER/STENT PLACEMENT
Anesthesia: General | Laterality: Left

## 2020-11-19 MED ORDER — SUGAMMADEX SODIUM 200 MG/2ML IV SOLN
INTRAVENOUS | Status: DC | PRN
  Administered 2020-11-19: 200 mg via INTRAVENOUS

## 2020-11-19 MED ORDER — FENTANYL CITRATE (PF) 100 MCG/2ML IJ SOLN: 25.0000 ug | INTRAMUSCULAR | Status: DC | PRN

## 2020-11-19 MED ORDER — ROCURONIUM BROMIDE 100 MG/10ML IV SOLN
INTRAVENOUS | Status: DC | PRN
  Administered 2020-11-19: 50 mg via INTRAVENOUS

## 2020-11-19 MED ORDER — LACTATED RINGERS IV SOLN: INTRAVENOUS | Status: DC | PRN

## 2020-11-19 MED ORDER — OXYCODONE HCL 5 MG PO TABS: 5.0000 mg | ORAL_TABLET | Freq: Once | ORAL | Status: DC | PRN

## 2020-11-19 MED ORDER — LIDOCAINE HCL (CARDIAC) PF 100 MG/5ML IV SOSY
PREFILLED_SYRINGE | INTRAVENOUS | Status: DC | PRN
  Administered 2020-11-19: 50 mg via INTRAVENOUS

## 2020-11-19 MED ORDER — ONDANSETRON HCL 4 MG/2ML IJ SOLN: 4.0000 mg | Freq: Once | INTRAMUSCULAR | Status: DC | PRN

## 2020-11-19 MED ORDER — TAMSULOSIN HCL 0.4 MG PO CAPS: 0.4000 mg | ORAL_CAPSULE | Freq: Every day | ORAL | 0 refills | Status: DC

## 2020-11-19 MED ORDER — ONDANSETRON HCL 4 MG/2ML IJ SOLN
INTRAMUSCULAR | Status: DC | PRN
  Administered 2020-11-19: 4 mg via INTRAVENOUS

## 2020-11-19 MED ORDER — PHENYLEPHRINE HCL (PRESSORS) 10 MG/ML IV SOLN
INTRAVENOUS | Status: AC
  Filled 2020-11-19: qty 1

## 2020-11-19 MED ORDER — SODIUM CHLORIDE 0.9 % IR SOLN
Status: DC | PRN
  Administered 2020-11-19: 3000 mL

## 2020-11-19 MED ORDER — FENTANYL CITRATE (PF) 100 MCG/2ML IJ SOLN
INTRAMUSCULAR | Status: AC
  Filled 2020-11-19: qty 2

## 2020-11-19 MED ORDER — IOHEXOL 180 MG/ML  SOLN
INTRAMUSCULAR | Status: DC | PRN
  Administered 2020-11-19: 10 mL

## 2020-11-19 MED ORDER — HYDROCODONE-ACETAMINOPHEN 5-325 MG PO TABS: 1.0000 | ORAL_TABLET | ORAL | 0 refills | Status: DC | PRN

## 2020-11-19 MED ORDER — MIDAZOLAM HCL 2 MG/2ML IJ SOLN
INTRAMUSCULAR | Status: DC | PRN
  Administered 2020-11-19: 2 mg via INTRAVENOUS

## 2020-11-19 MED ORDER — MIDAZOLAM HCL 2 MG/2ML IJ SOLN
INTRAMUSCULAR | Status: AC
  Filled 2020-11-19: qty 2

## 2020-11-19 MED ORDER — OXYBUTYNIN CHLORIDE 5 MG PO TABS: ORAL_TABLET | ORAL | 0 refills | Status: DC

## 2020-11-19 MED ORDER — FENTANYL CITRATE (PF) 100 MCG/2ML IJ SOLN
INTRAMUSCULAR | Status: DC | PRN
  Administered 2020-11-19 (×2): 50 ug via INTRAVENOUS

## 2020-11-19 MED ORDER — CHLORHEXIDINE GLUCONATE 0.12 % MT SOLN
OROMUCOSAL | Status: AC
  Administered 2020-11-19: 15 mL via OROMUCOSAL
  Filled 2020-11-19: qty 15

## 2020-11-19 MED ORDER — PROPOFOL 10 MG/ML IV BOLUS
INTRAVENOUS | Status: DC | PRN
  Administered 2020-11-19: 150 mg via INTRAVENOUS

## 2020-11-19 MED ORDER — OXYCODONE HCL 5 MG/5ML PO SOLN
5.0000 mg | Freq: Once | ORAL | Status: DC | PRN
Start: 2020-11-19 — End: 2020-11-19

## 2020-11-19 MED ORDER — ACETAMINOPHEN 10 MG/ML IV SOLN: 1000.0000 mg | Freq: Once | INTRAVENOUS | Status: DC | PRN

## 2020-11-19 MED ORDER — 0.9 % SODIUM CHLORIDE (POUR BTL) OPTIME
TOPICAL | Status: DC | PRN
  Administered 2020-11-19: 10 mL

## 2020-11-19 MED ORDER — DEXAMETHASONE SODIUM PHOSPHATE 10 MG/ML IJ SOLN
INTRAMUSCULAR | Status: DC | PRN
  Administered 2020-11-19: 5 mg via INTRAVENOUS

## 2020-11-19 SURGICAL SUPPLY — 31 items
BAG DRAIN CYSTO-URO LG1000N (MISCELLANEOUS) ×3 IMPLANT
BASKET ZERO TIP 1.9FR (BASKET) ×3 IMPLANT
BRUSH SCRUB EZ 1% IODOPHOR (MISCELLANEOUS) ×3 IMPLANT
BSKT STON RTRVL ZERO TP 1.9FR (BASKET) ×2
CATH URET FLEX-TIP 2 LUMEN 10F (CATHETERS) ×3 IMPLANT
CATH URETL OPEN 5X70 (CATHETERS) IMPLANT
CNTNR SPEC 2.5X3XGRAD LEK (MISCELLANEOUS) ×2
CONT SPEC 4OZ STER OR WHT (MISCELLANEOUS) ×1
CONT SPEC 4OZ STRL OR WHT (MISCELLANEOUS) ×2
CONTAINER SPEC 2.5X3XGRAD LEK (MISCELLANEOUS) ×2 IMPLANT
DRAPE UTILITY 15X26 TOWEL STRL (DRAPES) ×3 IMPLANT
FIBER LASER MOSES 200 DFL (Laser) ×3 IMPLANT
GAUZE 4X4 16PLY ~~LOC~~+RFID DBL (SPONGE) ×6 IMPLANT
GLOVE SURG UNDER POLY LF SZ7.5 (GLOVE) ×3 IMPLANT
GOWN STRL REUS W/ TWL LRG LVL3 (GOWN DISPOSABLE) ×2 IMPLANT
GOWN STRL REUS W/ TWL XL LVL3 (GOWN DISPOSABLE) ×2 IMPLANT
GOWN STRL REUS W/TWL LRG LVL3 (GOWN DISPOSABLE) ×3
GOWN STRL REUS W/TWL XL LVL3 (GOWN DISPOSABLE) ×3
GUIDEWIRE STR DUAL SENSOR (WIRE) ×6 IMPLANT
INFUSOR MANOMETER BAG 3000ML (MISCELLANEOUS) ×3 IMPLANT
IV NS IRRIG 3000ML ARTHROMATIC (IV SOLUTION) ×3 IMPLANT
KIT TURNOVER CYSTO (KITS) ×3 IMPLANT
PACK CYSTO AR (MISCELLANEOUS) ×3 IMPLANT
SET CYSTO W/LG BORE CLAMP LF (SET/KITS/TRAYS/PACK) ×3 IMPLANT
SHEATH URETERAL 12FRX35CM (MISCELLANEOUS) ×3 IMPLANT
STENT URET 6FRX24 CONTOUR (STENTS) ×3 IMPLANT
STENT URET 6FRX26 CONTOUR (STENTS) IMPLANT
SURGILUBE 2OZ TUBE FLIPTOP (MISCELLANEOUS) ×3 IMPLANT
TRACTIP FLEXIVA PULSE ID 200 (Laser) IMPLANT
VALVE UROSEAL ADJ ENDO (VALVE) ×3 IMPLANT
WATER STERILE IRR 1000ML POUR (IV SOLUTION) ×3 IMPLANT

## 2020-11-19 NOTE — Anesthesia Postprocedure Evaluation (Signed)
Anesthesia Post Note  Patient: Holly Dickson  Procedure(s) Performed: CYSTOSCOPY/URETEROSCOPY/HOLMIUM LASER/STENT EXCHANGE (Left) CYSTOSCOPY WITH RETROGRADE PYELOGRAM  Patient location during evaluation: PACU Anesthesia Type: General Level of consciousness: awake and alert Pain management: pain level controlled Vital Signs Assessment: post-procedure vital signs reviewed and stable Respiratory status: spontaneous breathing, nonlabored ventilation, respiratory function stable and patient connected to nasal cannula oxygen Cardiovascular status: blood pressure returned to baseline and stable Postop Assessment: no apparent nausea or vomiting Anesthetic complications: no   No notable events documented.   Last Vitals:  Vitals:   11/19/20 1000 11/19/20 1005  BP: (!) 150/86 (!) 149/81  Pulse: 81 83  Resp: 12 16  Temp: (!) 36.2 C 36.8 C  SpO2: 98% 97%    Last Pain:  Vitals:   11/19/20 1005  TempSrc: Temporal  PainSc: 0-No pain                 Corinda Gubler

## 2020-11-19 NOTE — Telephone Encounter (Signed)
Please schedule cystoscopy with stent removal next week.  KUB prior .  Notified patient as instructed, patient pleased. Discussed follow-up appointments, patient agrees

## 2020-11-19 NOTE — Transfer of Care (Signed)
Immediate Anesthesia Transfer of Care Note  Patient: Holly Dickson  Procedure(s) Performed: CYSTOSCOPY/URETEROSCOPY/HOLMIUM LASER/STENT EXCHANGE (Left)  Patient Location: PACU  Anesthesia Type:General  Level of Consciousness: awake and alert   Airway & Oxygen Therapy: Patient Spontanous Breathing  Post-op Assessment: Report given to RN and Post -op Vital signs reviewed and stable  Post vital signs: Reviewed and stable  Last Vitals:  Vitals Value Taken Time  BP 156/91 11/19/20 0937  Temp 36.5 C 11/19/20 0937  Pulse 86 11/19/20 0940  Resp 15 11/19/20 0940  SpO2 95 % 11/19/20 0940  Vitals shown include unvalidated device data.  Last Pain:  Vitals:   11/19/20 0937  TempSrc:   PainSc: Asleep         Complications: No notable events documented.

## 2020-11-19 NOTE — Anesthesia Procedure Notes (Signed)
Procedure Name: Intubation Date/Time: 11/19/2020 8:03 AM Performed by: Louann Sjogren, CRNA Pre-anesthesia Checklist: Patient identified, Patient being monitored, Timeout performed, Emergency Drugs available and Suction available Patient Re-evaluated:Patient Re-evaluated prior to induction Oxygen Delivery Method: Circle system utilized Preoxygenation: Pre-oxygenation with 100% oxygen Induction Type: IV induction Ventilation: Mask ventilation without difficulty Laryngoscope Size: Mac, 3 and 4 Grade View: Grade II Tube type: Oral Tube size: 6.5 mm Number of attempts: 1 Airway Equipment and Method: Stylet Placement Confirmation: ETT inserted through vocal cords under direct vision, positive ETCO2 and breath sounds checked- equal and bilateral Secured at: 21 cm Tube secured with: Tape Dental Injury: Teeth and Oropharynx as per pre-operative assessment

## 2020-11-19 NOTE — Op Note (Signed)
Preoperative diagnosis: Left nephrolithiasis   Postoperative diagnosis: Left nephrolithiasis  Procedure:  Cystoscopy Left ureteroscopy and stone removal Ureteroscopic laser lithotripsy Left ureteral stent exchange (40F/24 cm)  Left retrograde pyelography with interpretation  Surgeon: Lorin Picket C. Hosanna Betley, M.D.  Anesthesia: General  Complications: None  Intraoperative findings:  Cystoscopy: No solid or papillary lesions.  Mild inflammatory changes left hemitrigone secondary to indwelling stent Ureteropyeloscopy: No ureteral abnormalities identified.  Calculus was within a lower pole calyx. Left retrograde pyelography demonstrated a filling defect within the lower pole calyx consistent with the patient's known calculus without other abnormalities.   EBL: Minimal  Specimens: Calculus fragments for analysis   Indication: Holly Dickson is a 70 y.o. female status post emergent left ureteral stent placement at [facility] 10/18/2020 for an obstructing 16 mm left UPJ calculus with pyelonephritis/sepsis.  She requested definitive stone treatment locally and presents today for same. After reviewing the management options for treatment, the patient elected to proceed with the above surgical procedure(s). We have discussed the potential benefits and risks of the procedure, side effects of the proposed treatment, the likelihood of the patient achieving the goals of the procedure, and any potential problems that might occur during the procedure or recuperation. Informed consent has been obtained.  Description of procedure:  The patient was taken to the operating room and general anesthesia was induced.  The patient was placed in the dorsal lithotomy position, prepped and draped in the usual sterile fashion, and preoperative antibiotics were administered. A preoperative time-out was performed.   A 21 French cystoscope was lubricated and passed per urethra with findings as described above  The  indwelling stent was grasped with endoscopic forceps and brought out through the urethral meatus.  A 0.038 Sensor wire was then placed through the stent and into an upper pole calyx under fluoroscopic guidance.  The calculus was seen on fluoroscopy lateral to the renal pelvis. A dual-lumen catheter was placed over the sensor wire and a second sensor wire was placed in a similar fashion.  A 12/14 French ureteral access sheath was placed over the working wire under fluoroscopic guidance without difficulty.  A dual channel digital flexible ureteroscope was placed through the access sheath and advanced into the renal pelvis without difficulty with findings as described above.     A 242 micron holmium laser fiber was placed through the ureteroscope and the stone was dusted at a setting of 0.3 J and frequency of 60 hz.   During dusting several larger fragments broke off the stone and these were further fragmented with noncontact laser lithotripsy at a setting of 0.6/40  Retrograde pyelograms performed through the ureteroscope and all calyces were examined under fluoroscopic guidance.  Only 1 fragment was identified in the lower pole calyx larger than the tip of the laser fiber and was removed with a 1.9 [place] nitinol basket.  The ureteral access sheath and ureteroscope were removed in tandem and the ureter showed no evidence of injury or perforation.  A 6 FR/24 CM Contour ureteral stent was placed under fluoroscopic guidance.  The wire was then removed with an adequate stent curl noted in the upper pole calyx as well as in the bladder.  The bladder was then emptied and the procedure ended.  The patient appeared to tolerate the procedure well and without complications.  After anesthetic reversal the patient was transported to the PACU in stable condition.   Plan: Office follow-up next week for cystoscopy with stent removal; KUB prior   [employer]  Bernardo Heater, MD

## 2020-11-19 NOTE — Discharge Instructions (Addendum)
AMBULATORY SURGERY  DISCHARGE INSTRUCTIONS   The drugs that you were given will stay in your system until tomorrow so for the next 24 hours you should not:  Drive an automobile Make any legal decisions Drink any alcoholic beverage   You may resume regular meals tomorrow.  Today it is better to start with liquids and gradually work up to solid foods.  You may eat anything you prefer, but it is better to start with liquids, then soup and crackers, and gradually work up to solid foods.   Please notify your doctor immediately if you have any unusual bleeding, trouble breathing, redness and pain at the surgery site, drainage, fever, or pain not relieved by medication.    Additional Instructions:  DISCHARGE INSTRUCTIONS FOR KIDNEY STONE/URETERAL STENT   MEDICATIONS:  1. Resume all your other meds from home.  2.  AZO (over-the-counter) can help with the burning/stinging when you urinate. 3.  Hydrocodone is for moderate/severe pain, Rx was sent to your pharmacy if needed. 4.  Rxs for oxybutynin and tamsulosin which can help stent irritation were sent to your pharmacy  ACTIVITY:  1. May resume regular activities in 24 hours. 2. No driving while on narcotic pain medications  3. Drink plenty of water  4. Continue to walk at home - you can still get blood clots when you are at home, so keep active, but don't over do it.  5. May return to work/school tomorrow or when you feel ready     SIGNS/SYMPTOMS TO CALL:  Common postoperative symptoms include urinary frequency, urgency, bladder spasm and blood in the urine  Please call us if you have a fever greater than 101.5, uncontrolled nausea/vomiting, uncontrolled pain, dizziness, unable to urinate, excessively bloody urine, chest pain, shortness of breath, leg swelling, leg pain, or any other concerns or questions.   You can reach [place] at [phone].   FOLLOW-UP:  1. You will be contacted by our office for an appointment next week to  have your stent removed      Please contact your physician with any problems or Same Day Surgery at [phone], Monday through Friday 6 am to 4 pm, or [place] at [facility] number at [phone].

## 2020-11-19 NOTE — Interval H&P Note (Signed)
History and Physical Interval Note:  11/19/2020 7:48 AM  Holly Dickson  has presented today for surgery, with the diagnosis of Left Nephrolithiasis.  The various methods of treatment have been discussed with the patient and family. After consideration of risks, benefits and other options for treatment, the patient has consented to  Procedure(s): CYSTOSCOPY/URETEROSCOPY/HOLMIUM LASER/STENT PLACEMENT (Left) as a surgical intervention.  The patient's history has been reviewed, patient examined, no change in status, stable for surgery.  I have reviewed the patient's chart and labs.  Questions were answered to the patient's satisfaction.     Shawndale Kilpatrick C Seri Kimmer

## 2020-11-19 NOTE — Anesthesia Preprocedure Evaluation (Signed)
Anesthesia Evaluation  Patient identified by MRN, date of birth, ID band Patient awake    Reviewed: Allergy & Precautions, NPO status , Patient's Chart, lab work & pertinent test results  History of Anesthesia Complications Negative for: history of anesthetic complications  Airway Mallampati: II  TM Distance: >3 FB Neck ROM: Full    Dental no notable dental hx. (+) Teeth Intact   Pulmonary neg pulmonary ROS, neg sleep apnea, neg COPD, Patient abstained from smoking.Not current smoker,    Pulmonary exam normal breath sounds clear to auscultation       Cardiovascular Exercise Tolerance: Good METShypertension, Pt. on medications (-) CAD and (-) Past MI (-) dysrhythmias  Rhythm:Regular Rate:Normal - Systolic murmurs    Neuro/Psych negative neurological ROS  negative psych ROS   GI/Hepatic neg GERD  ,(+)     (-) substance abuse  ,   Endo/Other  neg diabetes  Renal/GU Renal disease     Musculoskeletal   Abdominal   Peds  Hematology   Anesthesia Other Findings Past Medical History: No date: Hypertension  Reproductive/Obstetrics                             Anesthesia Physical Anesthesia Plan  ASA: 2  Anesthesia Plan: General   Post-op Pain Management:    Induction: Intravenous  PONV Risk Score and Plan: 4 or greater and Ondansetron, Dexamethasone and Treatment may vary due to age or medical condition  Airway Management Planned: LMA and Oral ETT  Additional Equipment: None  Intra-op Plan:   Post-operative Plan: Extubation in OR  Informed Consent: I have reviewed the patients History and Physical, chart, labs and discussed the procedure including the risks, benefits and alternatives for the proposed anesthesia with the patient or authorized representative who has indicated his/her understanding and acceptance.     Dental advisory given  Plan Discussed with: CRNA and  Surgeon  Anesthesia Plan Comments: (Discussed risks of anesthesia with patient, including PONV, sore throat, lip/dental damage. Rare risks discussed as well, such as cardiorespiratory and neurological sequelae, and allergic reactions. Patient understands.)        Anesthesia Quick Evaluation

## 2020-11-25 LAB — CALCULI, WITH PHOTOGRAPH (CLINICAL LAB)
Calcium Oxalate Monohydrate: 100 %
Weight Calculi: 16 mg

## 2020-11-27 ENCOUNTER — Other Ambulatory Visit: Payer: Self-pay

## 2020-11-27 ENCOUNTER — Ambulatory Visit
Admission: RE | Admit: 2020-11-27 | Discharge: 2020-11-27 | Disposition: A | Discharge status: Home / Self Care | Payer: Medicare HMO | Attending: Urology | Admitting: Urology

## 2020-11-27 ENCOUNTER — Ambulatory Visit (INDEPENDENT_AMBULATORY_CARE_PROVIDER_SITE_OTHER): Payer: Medicare HMO | Admitting: Urology

## 2020-11-27 ENCOUNTER — Encounter: Payer: Self-pay | Admitting: Urology

## 2020-11-27 ENCOUNTER — Ambulatory Visit
Admission: RE | Admit: 2020-11-27 | Discharge: 2020-11-27 | Disposition: A | Discharge status: Home / Self Care | Payer: Medicare HMO | Source: Ambulatory Visit | Attending: Urology | Admitting: Urology

## 2020-11-27 VITALS — BP 96/63 | HR 98 | Ht 63.0 in | Wt 149.0 lb

## 2020-11-27 DIAGNOSIS — N200 Calculus of kidney: Secondary | ICD-10-CM | POA: Diagnosis present

## 2020-11-27 DIAGNOSIS — Z466 Encounter for fitting and adjustment of urinary device: Secondary | ICD-10-CM | POA: Diagnosis not present

## 2020-11-27 MED ORDER — SULFAMETHOXAZOLE-TRIMETHOPRIM 800-160 MG PO TABS
1.0000 | ORAL_TABLET | Freq: Once | ORAL | Status: AC
Start: 2020-11-27 — End: 2020-11-27
  Administered 2020-11-27: 1 via ORAL

## 2020-11-27 NOTE — Progress Notes (Signed)
Indications: Patient is 70 y.o., who is s/p ureteroscopic removal of a 16 mm left UPJ calculus 11/19/2020.  She had no postoperative complaints the patient is presenting today for stent removal.  KUB performed this morning was reviewed and there are fine powdery fragments alongside the stent in the proximal ureter  Procedure:  Flexible Cystoscopy with stent removal (65993)  Timeout was performed and the correct patient, procedure and participants were identified.    Description:  The patient was prepped and draped in the usual sterile fashion. Flexible cystosopy was performed.  The stent was visualized, grasped, and removed intact without difficulty. The patient tolerated the procedure well.  A single dose of oral antibiotics was given.  Complications:  None  Plan:  Instructed to call for fever/flank pain post stent removal Stone analysis CaOxMono 100% Follow-up 1 month with KUB I did recommend a metabolic evaluation.  She would like to think over and will discuss further at follow-up visit   Irineo Axon, MD

## 2020-11-28 LAB — URINALYSIS, COMPLETE
Bilirubin, UA: NEGATIVE
Glucose, UA: NEGATIVE
Ketones, UA: NEGATIVE
Nitrite, UA: NEGATIVE
Specific Gravity, UA: 1.02 (ref 1.005–1.030)
Urobilinogen, Ur: 0.2 mg/dL (ref 0.2–1.0)
pH, UA: 5.5 (ref 5.0–7.5)

## 2020-11-28 LAB — MICROSCOPIC EXAMINATION

## 2020-11-30 ENCOUNTER — Encounter: Payer: Self-pay | Admitting: Urology

## 2020-12-26 ENCOUNTER — Ambulatory Visit
Admission: RE | Admit: 2020-12-26 | Discharge: 2020-12-26 | Disposition: A | Discharge status: Home / Self Care | Payer: Medicare HMO | Source: Ambulatory Visit | Attending: Urology | Admitting: Urology

## 2020-12-26 ENCOUNTER — Other Ambulatory Visit: Payer: Self-pay

## 2020-12-26 ENCOUNTER — Ambulatory Visit
Admission: RE | Admit: 2020-12-26 | Discharge: 2020-12-26 | Disposition: A | Discharge status: Home / Self Care | Payer: Medicare HMO | Attending: Urology | Admitting: Urology

## 2020-12-26 ENCOUNTER — Ambulatory Visit: Payer: Medicare HMO | Admitting: Urology

## 2020-12-26 ENCOUNTER — Encounter: Payer: Self-pay | Admitting: Urology

## 2020-12-26 VITALS — BP 110/71 | HR 90 | Ht 63.0 in | Wt 150.0 lb

## 2020-12-26 DIAGNOSIS — N200 Calculus of kidney: Secondary | ICD-10-CM | POA: Insufficient documentation

## 2020-12-26 DIAGNOSIS — Z466 Encounter for fitting and adjustment of urinary device: Secondary | ICD-10-CM

## 2020-12-26 NOTE — Progress Notes (Signed)
12/26/2020 10:57 AM   Holly Dickson 1950-06-05 [id number]  Referring provider: Danella Penton, MD [phone] [facility] [address] [facility] [place],  [place] [address]  Chief Complaint  Patient presents with   Nephrolithiasis    Urologic history: 1.  Recurrent nephrolithiasis Right URS/laser lithotripsy/stone removal [facility] 12/28/2018 Obstructing 14 mm left proximal ureteral calculus with septic shock July 2022 Emergent stent placement with clinical improvement Left URS/laser lithotripsy/stone removal [facility] 11/19/2020   HPI: 70 y.o. female presents for postop follow-up.  Stent removed 11/27/2020 and feels much better after stent removal KUB today reviewed and the powdery fragments seen on KUB at the time of stent removal are no longer visualized CT showed no remaining stones She has declined pursuing a metabolic evaluation   PMH: Past Medical History:  Diagnosis Date   Hypertension     Surgical History: Past Surgical History:  Procedure Laterality Date   BOWEL RESECTION N/A 10/18/2018   Procedure: SMALL BOWEL RESECTION;  Surgeon: Ancil Linsey, MD;  Location: [facility] ORS;  Service: General;  Laterality: N/A;   CHOLECYSTECTOMY N/A 10/15/2018   Procedure: LAPAROSCOPIC CHOLECYSTECTOMY with umbilical hernia repair;  Surgeon: Henrene Dodge, MD;  Location: [facility];  Service: General;  Laterality: N/A;   CYSTOSCOPY W/ RETROGRADES  11/19/2020   Procedure: CYSTOSCOPY WITH RETROGRADE PYELOGRAM;  Surgeon: Riki Altes, MD;  Location: [facility];  Service: Urology;;   CYSTOSCOPY WITH STENT PLACEMENT Right 10/15/2018   Procedure: CYSTOSCOPY WITH STENT PLACEMENT,right retrograde pyelogram;  Surgeon: Crist Fat, MD;  Location: [facility];  Service: Urology;  Laterality: Right;   CYSTOSCOPY/URETEROSCOPY/HOLMIUM LASER/STENT PLACEMENT Left 11/19/2020   Procedure: CYSTOSCOPY/URETEROSCOPY/HOLMIUM LASER/STENT EXCHANGE;  Surgeon: Riki Altes, MD;  Location: [facility];   Service: Urology;  Laterality: Left;   LAPAROTOMY N/A 10/18/2018   Procedure: EXPLORATORY LAPAROTOMY;  Surgeon: Ancil Linsey, MD;  Location: [facility];  Service: General;  Laterality: N/A;    Home Medications:  Allergies as of 12/26/2020       Reactions   Levofloxacin Nausea Only   Chest pain , headache        Medication List        Accurate as of December 26, 2020 10:57 AM. If you have any questions, ask your nurse or doctor.          acetaminophen 500 MG tablet Commonly known as: TYLENOL Take 2 tablets (1,000 mg total) by mouth every 6 (six) hours as needed for mild pain or fever.   acyclovir ointment 5 % Commonly known as: ZOVIRAX Apply 1 application topically daily as needed (irritation).   amLODipine 5 MG tablet Commonly known as: NORVASC Take 5 mg by mouth at bedtime.   Biotin 10175 MCG Tabs Take 10,000 mcg by mouth daily.   CRANBERRY PO Take 1 tablet by mouth daily.   Dialyvite Vitamin D 5000 125 MCG (5000 UT) capsule Generic drug: Cholecalciferol Take 5,000 Units by mouth daily.   escitalopram 20 MG tablet Commonly known as: LEXAPRO Take 10 mg by mouth daily.   Eye Vitamins Caps Take 2 capsules by mouth daily. Ultimate eye support   Hair/Skin/Nails Caps Take 2 capsules by mouth daily.   Flaxseed Oil 1200 MG Caps Take 1,200 mg by mouth daily.   ketoconazole 2 % cream Commonly known as: NIZORAL Apply 1 application topically daily as needed for irritation.   losartan 50 MG tablet Commonly known as: COZAAR Take 50 mg by mouth daily.   Magnesium 500 MG Caps Take  500 mg by mouth daily.   OMEGA 3 PO Take 2 capsules by mouth daily.   pyridOXINE 100 MG tablet Commonly known as: VITAMIN B-6 Take 100 mg by mouth daily.   Resveratrol 100 MG Caps Take 100 mg by mouth daily.   vitamin B-12 500 MCG tablet Commonly known as: CYANOCOBALAMIN Take 500 mcg by mouth daily.        Allergies:  Allergies  Allergen Reactions   Levofloxacin  Nausea Only    Chest pain , headache      Family History: No family history on file.  Social History:  reports that she has never smoked. She has never used smokeless tobacco. She reports that she does not currently use alcohol. She reports that she does not use drugs.   Physical Exam: BP 110/71   Pulse 90   Ht 5\' 3"  (1.6 m)   Wt 150 lb (68 kg)   BMI 26.57 kg/m   Constitutional:  Alert and oriented, No acute distress. HEENT: [place] AT, moist mucus membranes.  Trachea midline, no masses. Cardiovascular: No clubbing, cyanosis, or edema. Respiratory: Normal respiratory effort, no increased work of breathing.   Laboratory Data: Lab Results  Component Value Date   WBC 20.9 (H) 10/26/2018   HGB 8.6 (L) 10/26/2018   HCT 27.0 (L) 10/26/2018   MCV 89.1 10/26/2018   PLT 355 10/26/2018    Lab Results  Component Value Date   CREATININE 1.43 (H) 10/26/2018    No results found for: PSA  No results found for: TESTOSTERONE  No results found for: HGBA1C  Urinalysis    Component Value Date/Time   COLORURINE YELLOW (A) 10/26/2018 1635   APPEARANCEUR Hazy (A) 11/27/2020 1054   LABSPEC 1.013 10/26/2018 1635   PHURINE 7.0 10/26/2018 1635   GLUCOSEU Negative 11/27/2020 1054   HGBUR LARGE (A) 10/26/2018 1635   BILIRUBINUR Negative 11/27/2020 1054   KETONESUR NEGATIVE 10/26/2018 1635   PROTEINUR 2+ (A) 11/27/2020 1054   PROTEINUR 30 (A) 10/26/2018 1635   NITRITE Negative 11/27/2020 1054   NITRITE NEGATIVE 10/26/2018 1635   LEUKOCYTESUR 1+ (A) 11/27/2020 1054   LEUKOCYTESUR NEGATIVE 10/26/2018 1635    Lab Results  Component Value Date   LABMICR See below: 11/27/2020   WBCUA 11-30 (A) 11/27/2020   LABEPIT 0-10 11/27/2020   BACTERIA Moderate (A) 11/27/2020    Pertinent Imaging: KUB performed today was personally reviewed and interpreted   Assessment & Plan:    1.  Recurrent nephrolithiasis Doing well status post ureteroscopic stone removal She has declined pursuing a  metabolic evaluation We again reviewed general stone prevention guidelines and she was given literature on stone prevention Follow-up 1 year with KUB   01/27/2021, MD  [facility] [address] [place], [place] [phone]

## 2021-11-26 ENCOUNTER — Other Ambulatory Visit: Payer: Self-pay | Admitting: *Deleted

## 2021-11-26 DIAGNOSIS — N200 Calculus of kidney: Secondary | ICD-10-CM

## 2021-12-05 IMAGING — CR DG ABDOMEN 1V
1 series · 2 of 2 positions shown · non-contrast
Comparison: 10/19/2018

CLINICAL DATA: Kidney stone.  Left nephrolithiasis.

EXAM:
ABDOMEN - 1 VIEW

[Series 1: dg abd 1 view · 0.14mm/px · 2 of 2 slices shown]
[im 1/2]
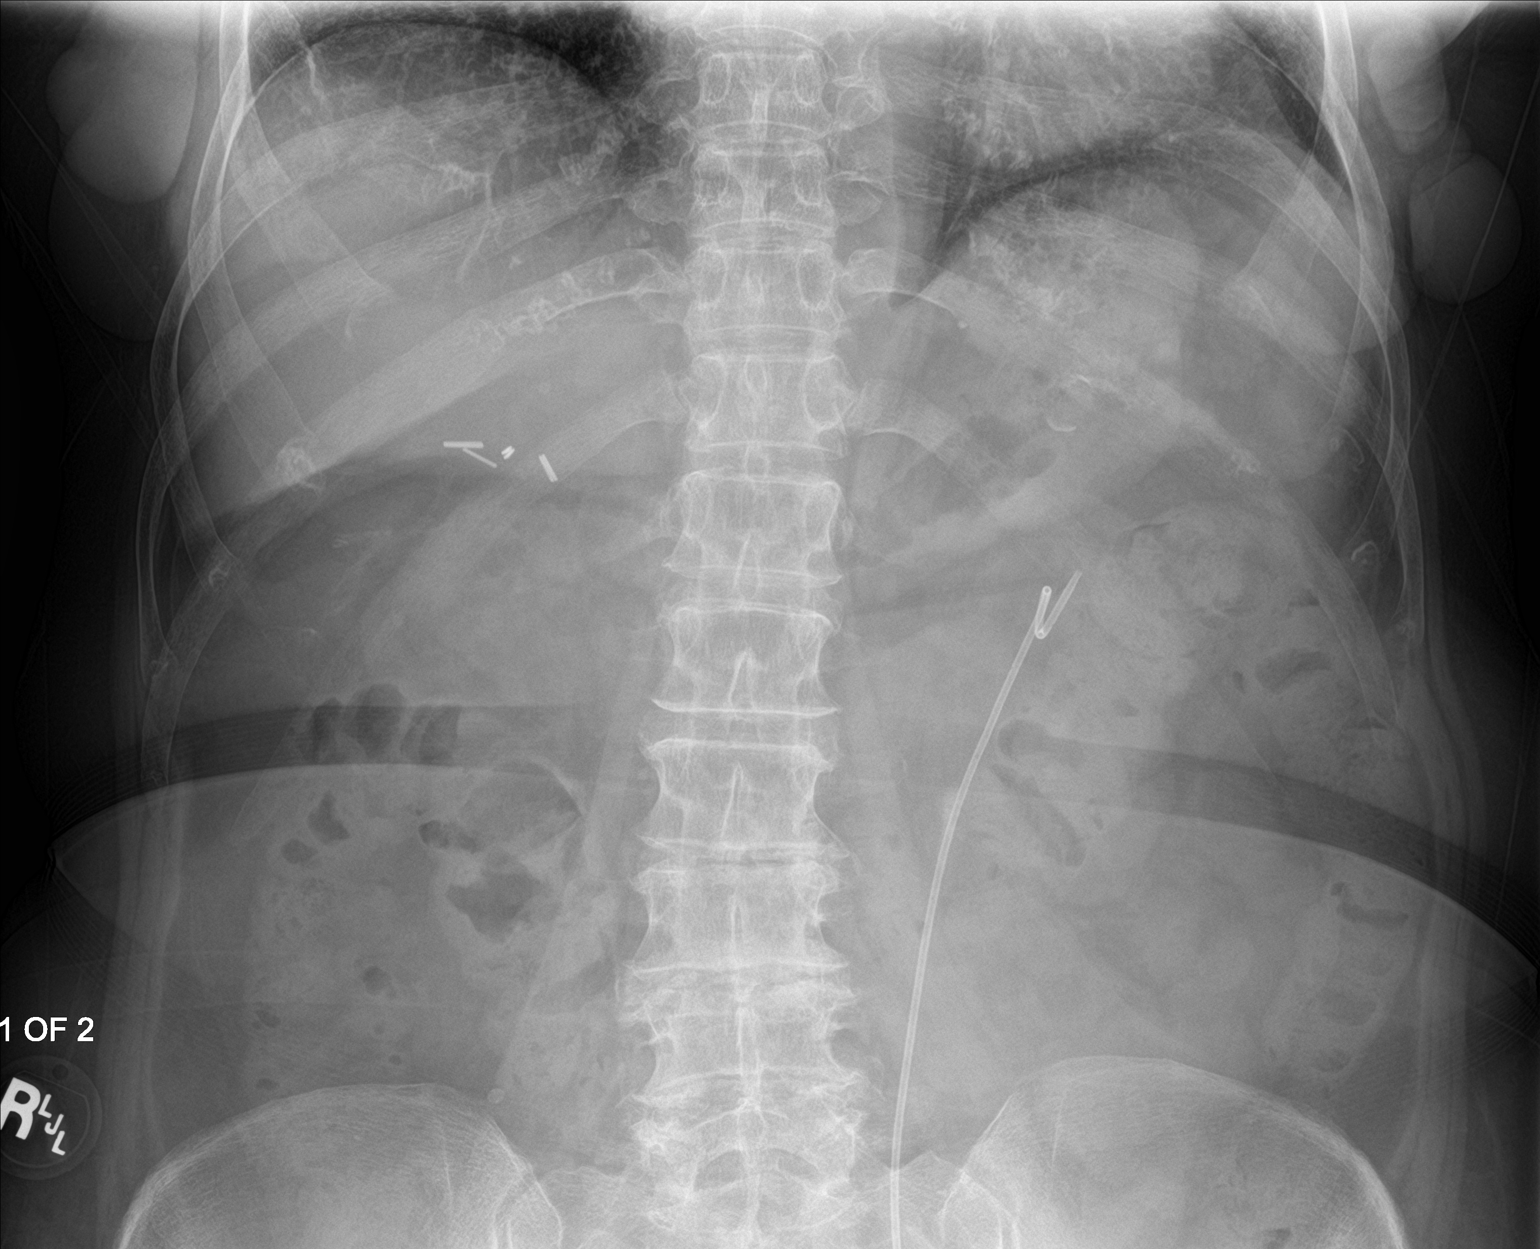
[im 2/2]
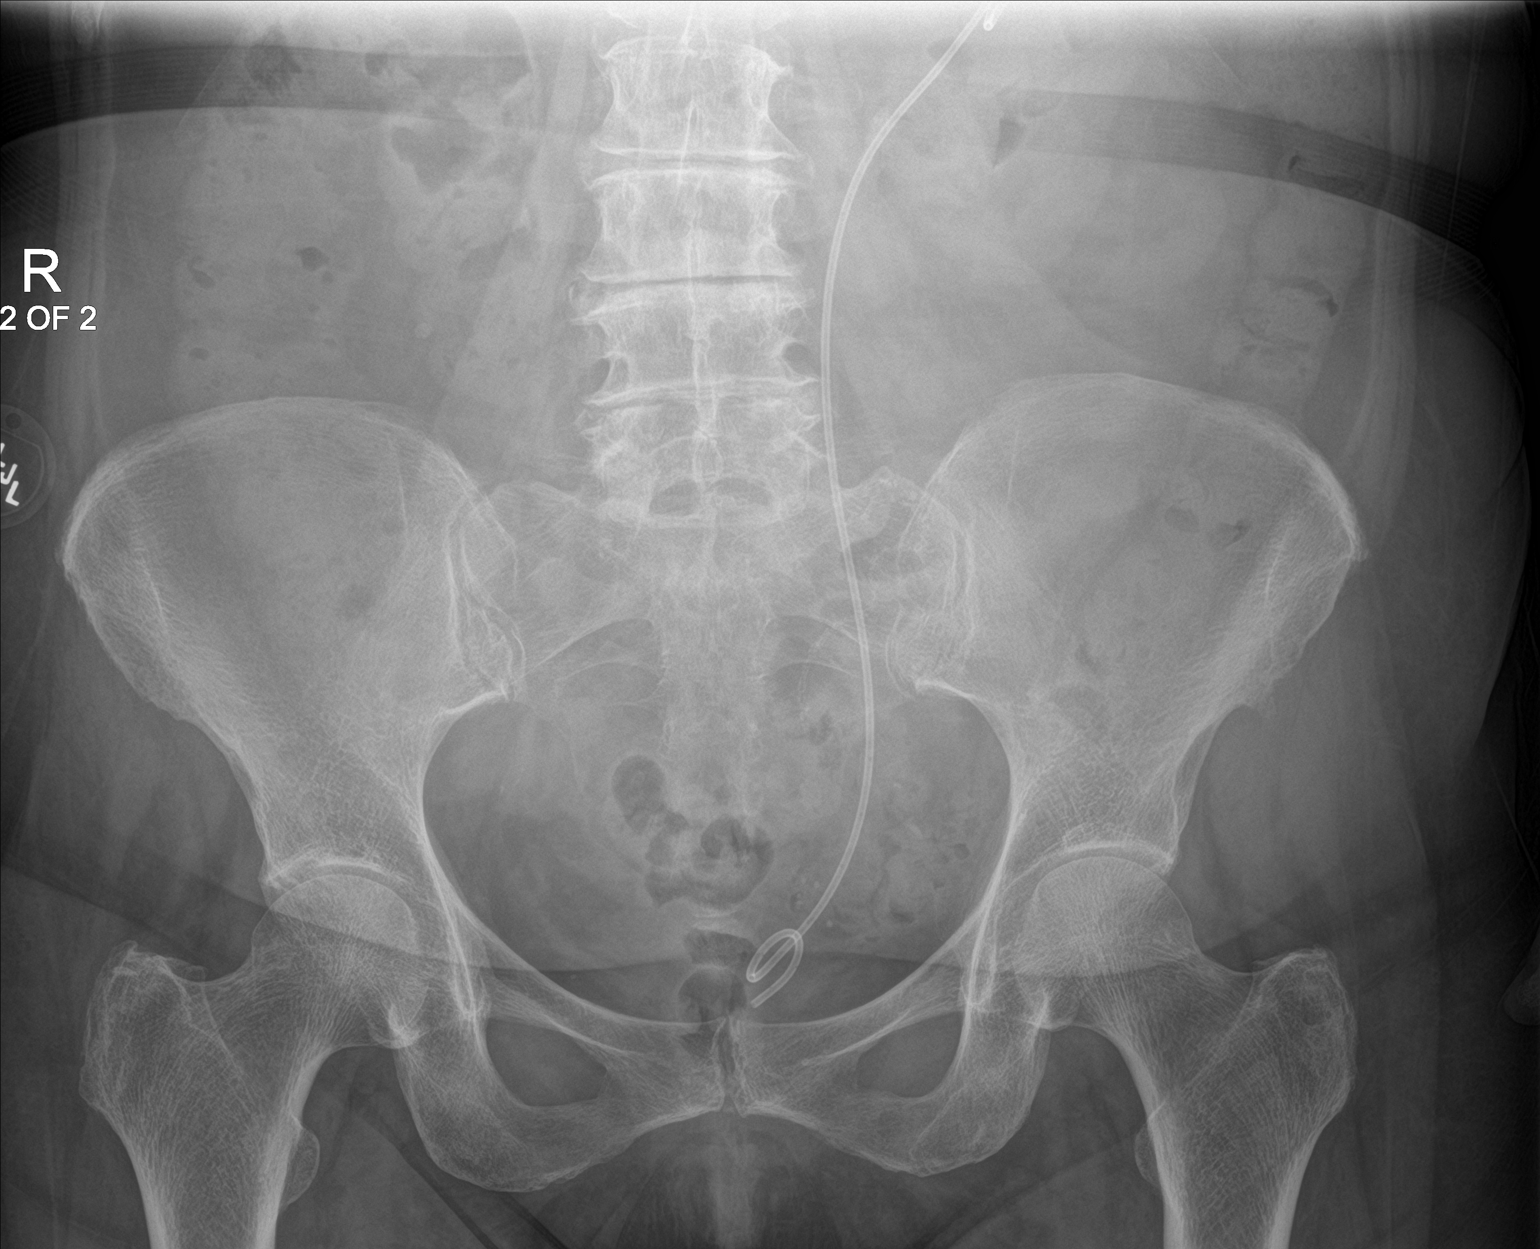

[2 of 2 positions shown; findings below may reference images not displayed]

FINDINGS: Left double-J internal ureteral stent noted. Apparent 8 mm stone
adjacent to the proximal stent, at the level of the left L3
transverse process. No definite calcified stones over the expected
location of either kidney. Patient is status post cholecystectomy.
Degenerative changes are noted in the lumbar spine.
IMPRESSION: 8 mm stone adjacent to the proximal stent, at the level of the left
L3 transverse process. No definite calcified stones over either
kidney on today's study.

## 2021-12-25 ENCOUNTER — Ambulatory Visit: Payer: Medicare HMO | Admitting: Urology

## 2021-12-25 ENCOUNTER — Ambulatory Visit
Admission: RE | Admit: 2021-12-25 | Discharge: 2021-12-25 | Disposition: A | Discharge status: Home / Self Care | Payer: Medicare HMO | Source: Ambulatory Visit | Attending: Urology | Admitting: Urology

## 2021-12-25 ENCOUNTER — Ambulatory Visit
Admission: RE | Admit: 2021-12-25 | Discharge: 2021-12-25 | Disposition: A | Discharge status: Home / Self Care | Payer: Medicare HMO | Attending: Urology | Admitting: Urology

## 2021-12-25 ENCOUNTER — Encounter: Payer: Self-pay | Admitting: Urology

## 2021-12-25 VITALS — BP 100/67 | HR 116 | Ht 63.0 in | Wt 150.0 lb

## 2021-12-25 DIAGNOSIS — Z87442 Personal history of urinary calculi: Secondary | ICD-10-CM | POA: Diagnosis not present

## 2021-12-25 DIAGNOSIS — N200 Calculus of kidney: Secondary | ICD-10-CM

## 2021-12-25 NOTE — Progress Notes (Signed)
12/25/2021 1:27 PM   [name] 04/09/1951 [id number]  Referring provider: Danella Penton, MD [phone] [facility] [address] [facility] [place],  [place] [address]  Chief Complaint  Patient presents with   Nephrolithiasis    Urologic history: 1.  Recurrent nephrolithiasis Right URS/laser lithotripsy/stone removal [facility] 12/28/2018 Obstructing 14 mm left proximal ureteral calculus with septic shock July 2022 Emergent stent placement with clinical improvement Left URS/laser lithotripsy/stone removal [facility] 11/19/2020   HPI: 71 y.o. female presents for annual follow-up.  Doing well since last visit No bothersome LUTS Denies dysuria, gross hematuria Denies flank, abdominal or pelvic pain   PMH: Past Medical History:  Diagnosis Date   Hypertension     Surgical History: Past Surgical History:  Procedure Laterality Date   BOWEL RESECTION N/A 10/18/2018   Procedure: SMALL BOWEL RESECTION;  Surgeon: Ancil Linsey, MD;  Location: [facility];  Service: General;  Laterality: N/A;   CHOLECYSTECTOMY N/A 10/15/2018   Procedure: LAPAROSCOPIC CHOLECYSTECTOMY with umbilical hernia repair;  Surgeon: Henrene Dodge, MD;  Location: [facility];  Service: General;  Laterality: N/A;   CYSTOSCOPY W/ RETROGRADES  11/19/2020   Procedure: CYSTOSCOPY WITH RETROGRADE PYELOGRAM;  Surgeon: Riki Altes, MD;  Location: [facility];  Service: Urology;;   CYSTOSCOPY WITH STENT PLACEMENT Right 10/15/2018   Procedure: CYSTOSCOPY WITH STENT PLACEMENT,right retrograde pyelogram;  Surgeon: Crist Fat, MD;  Location: [facility];  Service: Urology;  Laterality: Right;   CYSTOSCOPY/URETEROSCOPY/HOLMIUM LASER/STENT PLACEMENT Left 11/19/2020   Procedure: CYSTOSCOPY/URETEROSCOPY/HOLMIUM LASER/STENT EXCHANGE;  Surgeon: Riki Altes, MD;  Location: [facility];  Service: Urology;  Laterality: Left;   LAPAROTOMY N/A 10/18/2018   Procedure: EXPLORATORY LAPAROTOMY;  Surgeon: Ancil Linsey, MD;   Location: [facility];  Service: General;  Laterality: N/A;    Home Medications:  Allergies as of 12/25/2021       Reactions   Levofloxacin Nausea Only   Chest pain , headache        Medication List        Accurate as of December 25, 2021  1:27 PM. If you have any questions, ask your nurse or doctor.          STOP taking these medications    Eye Vitamins Caps Stopped by: Riki Altes, MD   Hair/Skin/Nails Caps Stopped by: Riki Altes, MD       TAKE these medications    acetaminophen 500 MG tablet Commonly known as: TYLENOL Take 2 tablets (1,000 mg total) by mouth every 6 (six) hours as needed for mild pain or fever.   acyclovir ointment 5 % Commonly known as: ZOVIRAX Apply 1 application topically daily as needed (irritation).   amLODipine 5 MG tablet Commonly known as: NORVASC Take 5 mg by mouth at bedtime.   Biotin 70623 MCG Tabs Take 10,000 mcg by mouth daily.   CRANBERRY PO Take 1 tablet by mouth daily.   cyanocobalamin 500 MCG tablet Commonly known as: VITAMIN B12 Take 500 mcg by mouth daily.   Dialyvite Vitamin D 5000 125 MCG (5000 UT) capsule Generic drug: Cholecalciferol Take 5,000 Units by mouth daily.   escitalopram 20 MG tablet Commonly known as: LEXAPRO Take 10 mg by mouth daily.   Flaxseed Oil 1200 MG Caps Take 1,200 mg by mouth daily.   ketoconazole 2 % cream Commonly known as: NIZORAL Apply 1 application topically daily as needed for irritation.   losartan 50 MG tablet Commonly known as: COZAAR Take 50 mg by mouth  daily.   Magnesium 500 MG Caps Take 500 mg by mouth daily.   OMEGA 3 PO Take 2 capsules by mouth daily.   pyridOXINE 100 MG tablet Commonly known as: VITAMIN B6 Take 100 mg by mouth daily.   Resveratrol 100 MG Caps Take 100 mg by mouth daily.        Allergies:  Allergies  Allergen Reactions   Levofloxacin Nausea Only    Chest pain , headache      Family History: No family history on  file.  Social History:  reports that she has never smoked. She has never used smokeless tobacco. She reports that she does not currently use alcohol. She reports that she does not use drugs.   Physical Exam: BP 100/67   Pulse (!) 116   Ht 5\' 3"  (1.6 m)   Wt 150 lb (68 kg)   BMI 26.57 kg/m   Constitutional:  Alert and oriented, No acute distress. HEENT: [place] AT, moist mucus membranes.  Trachea midline, no masses. Cardiovascular: No clubbing, cyanosis, or edema. Respiratory: Normal respiratory effort, no increased work of breathing.   Pertinent Imaging: KUB performed today was personally reviewed and interpreted.  No calcifications suspicious for urinary tract stones identified   Assessment & Plan:    1.  Recurrent nephrolithiasis No evidence recurrent calculi Follow-up 2 years with KUB Call earlier for recurrent flank pain, hematuria or voiding symptoms   , MD  [facility] [address] [place], [place] [phone]

## 2021-12-26 ENCOUNTER — Ambulatory Visit: Payer: Medicare HMO | Admitting: Urology

## 2021-12-28 ENCOUNTER — Encounter: Payer: Self-pay | Admitting: Urology

## 2022-04-23 ENCOUNTER — Emergency Department: Payer: Medicare HMO

## 2022-04-23 ENCOUNTER — Encounter: Payer: Self-pay | Admitting: Emergency Medicine

## 2022-04-23 DIAGNOSIS — R109 Unspecified abdominal pain: Secondary | ICD-10-CM | POA: Diagnosis present

## 2022-04-23 DIAGNOSIS — N189 Chronic kidney disease, unspecified: Secondary | ICD-10-CM | POA: Insufficient documentation

## 2022-04-23 LAB — URINALYSIS, ROUTINE W REFLEX MICROSCOPIC
Bacteria, UA: NONE SEEN
Bilirubin Urine: NEGATIVE
Glucose, UA: NEGATIVE mg/dL
Ketones, ur: NEGATIVE mg/dL
Nitrite: NEGATIVE
Protein, ur: NEGATIVE mg/dL
Specific Gravity, Urine: 1.018 (ref 1.005–1.030)
pH: 5 (ref 5.0–8.0)

## 2022-04-23 LAB — CBC WITH DIFFERENTIAL/PLATELET
Abs Immature Granulocytes: 0.01 10*3/uL (ref 0.00–0.07)
Basophils Absolute: 0.1 10*3/uL (ref 0.0–0.1)
Basophils Relative: 1 %
Eosinophils Absolute: 0.2 10*3/uL (ref 0.0–0.5)
Eosinophils Relative: 3 %
HCT: 40.5 % (ref 36.0–46.0)
Hemoglobin: 12.5 g/dL (ref 12.0–15.0)
Immature Granulocytes: 0 %
Lymphocytes Relative: 24 %
Lymphs Abs: 1.7 10*3/uL (ref 0.7–4.0)
MCH: 26.5 pg (ref 26.0–34.0)
MCHC: 30.9 g/dL (ref 30.0–36.0)
MCV: 86 fL (ref 80.0–100.0)
Monocytes Absolute: 0.5 10*3/uL (ref 0.1–1.0)
Monocytes Relative: 6 %
Neutro Abs: 4.7 10*3/uL (ref 1.7–7.7)
Neutrophils Relative %: 66 %
Platelets: 458 10*3/uL — ABNORMAL HIGH (ref 150–400)
RBC: 4.71 MIL/uL (ref 3.87–5.11)
RDW: 13.8 % (ref 11.5–15.5)
WBC: 7.2 10*3/uL (ref 4.0–10.5)
nRBC: 0 % (ref 0.0–0.2)

## 2022-04-23 LAB — COMPREHENSIVE METABOLIC PANEL
ALT: 10 U/L (ref 0–44)
AST: 19 U/L (ref 15–41)
Albumin: 3.9 g/dL (ref 3.5–5.0)
Alkaline Phosphatase: 96 U/L (ref 38–126)
Anion gap: 8 (ref 5–15)
BUN: 26 mg/dL — ABNORMAL HIGH (ref 8–23)
CO2: 24 mmol/L (ref 22–32)
Calcium: 9.2 mg/dL (ref 8.9–10.3)
Chloride: 106 mmol/L (ref 98–111)
Creatinine, Ser: 1.49 mg/dL — ABNORMAL HIGH (ref 0.44–1.00)
GFR, Estimated: 38 mL/min — ABNORMAL LOW (ref >60)
Glucose, Bld: 118 mg/dL — ABNORMAL HIGH (ref 70–99)
Potassium: 3.8 mmol/L (ref 3.5–5.1)
Sodium: 138 mmol/L (ref 135–145)
Total Bilirubin: 0.6 mg/dL (ref 0.3–1.2)
Total Protein: 7.2 g/dL (ref 6.5–8.1)

## 2022-04-23 NOTE — ED Triage Notes (Signed)
Pt presents via POV with complaints of L sided flank pain that started this AM. Hx of kidney stones with stent placement last year. Of note, pt was told she had hematuria yesterday while at the PCP. Denies N/V/D, CP or SOB.

## 2022-04-24 ENCOUNTER — Emergency Department
Admission: EM | Admit: 2022-04-24 | Discharge: 2022-04-24 | Disposition: A | Discharge status: Home / Self Care | Payer: Medicare HMO | Attending: Emergency Medicine | Admitting: Emergency Medicine

## 2022-04-24 DIAGNOSIS — R109 Unspecified abdominal pain: Secondary | ICD-10-CM

## 2022-04-24 NOTE — ED Provider Notes (Signed)
[facility] Provider Note    Event Date/Time   First MD Initiated Contact with Patient 04/24/22 0102     (approximate)   History   Flank Pain   HPI  Holly Dickson is a 72 y.o. female who presents for evaluation of acute onset left flank pain starting less than 24 hours ago (Thursday morning).  She has had extensive problems with her kidneys and ureteral stones in the past even requiring a stent, so she was concerned that this was another presentation of an obstructive stone.  She has had ongoing pain that is worse when she moves around and sometimes sends sharp stabbing pain in her left flank.  She thinks it is also possible she could have strained some muscles although she has not done anything in particular that she can think of, and has had no trauma.  She denies nausea, vomiting, diarrhea, chest pain, and shortness of breath.  She says she is feeling better by now.  She has had no dysuria and not observed any gross hematuria, however she coincidently had some outpatient labs done yesterday and was told that there was a little bit of blood in her urine.     Physical Exam   Triage Vital Signs: ED Triage Vitals  Enc Vitals Group     BP 04/23/22 1936 (!) 142/72     Pulse Rate 04/23/22 1936 79     Resp 04/23/22 1936 18     Temp 04/23/22 1936 97.9 F (36.6 C)     Temp Source 04/23/22 1936 Oral     SpO2 04/23/22 1936 98 %     Weight 04/23/22 1943 65.8 kg (145 lb)     Height 04/23/22 1943 1.6 m (5\' 3" )     Head Circumference --      Peak Flow --      Pain Score 04/23/22 1941 7     Pain Loc --      Pain Edu? --      Excl. in [place]? --     Most recent vital signs: Vitals:   04/23/22 1936 04/23/22 2342  BP: (!) 142/72 139/75  Pulse: 79 77  Resp: 18 17  Temp: 97.9 F (36.6 C) 98 F (36.7 C)  SpO2: 98% 100%     General: Awake, no distress.  CV:  Good peripheral perfusion.  Resp:  Normal effort.  Abd:  No distention.  No abdominal  tenderness to palpation. Other:  The patient has left flank reproducible tenderness to percussion.   ED Results / Procedures / Treatments   Labs (all labs ordered are listed, but only abnormal results are displayed) Labs Reviewed  CBC WITH DIFFERENTIAL/PLATELET - Abnormal; Notable for the following components:      Result Value   Platelets 458 (*)    All other components within normal limits  COMPREHENSIVE METABOLIC PANEL - Abnormal; Notable for the following components:   Glucose, Bld 118 (*)    BUN 26 (*)    Creatinine, Ser 1.49 (*)    GFR, Estimated 38 (*)    All other components within normal limits  URINALYSIS, ROUTINE W REFLEX MICROSCOPIC - Abnormal; Notable for the following components:   Color, Urine YELLOW (*)    APPearance CLEAR (*)    Hgb urine dipstick SMALL (*)    Leukocytes,Ua TRACE (*)    All other components within normal limits  URINE CULTURE     RADIOLOGY I viewed and interpreted the patient's CT renal  stone study.  I see no evidence of pyelonephritis or renal/ureteral stone.  I also read the radiologist's report, which confirmed no acute findings.    PROCEDURES:  Critical Care performed: No  Procedures   MEDICATIONS ORDERED IN ED: Medications - No data to display   IMPRESSION / MDM / [place] / ED COURSE  I reviewed the triage vital signs and the nursing notes.                              Differential diagnosis includes, but is not limited to, renal/ureteral stone, UTI/pyelonephritis, musculoskeletal strain, less likely PE or atypical ACS presentation.  Patient's presentation is most consistent with acute presentation with potential threat to life or bodily function.  Lab/studies ordered: CMP, urinalysis, CBC with differential, CT renal stone study.  Vital signs are stable and within normal limits.  Labs are notable for chronic kidney disease, unchanged from prior.  No leukocytosis, normal hemoglobin, urinalysis shows trace  leukocytes and small hemoglobin but otherwise is reassuring.  I ordered a urine culture but the patient is asymptomatic with no dysuria and I will not treat empirically.  As document above, CT renal stone protocol does not show any evidence of acute abnormality including no renal/ureteral stone.  The patient is reassured by this.  Given that the pain is improving, we talked about how it is possible she passed a stone that is no longer present.  It could also be musculoskeletal.  I offered Lidoderm patches but the patient would prefer to try ibuprofen, Tylenol, and heating pads.  I recommended close outpatient follow-up and I gave my usual and customary return precautions.       FINAL CLINICAL IMPRESSION(S) / ED DIAGNOSES   Final diagnoses:  Left flank pain     Rx / DC Orders   ED Discharge Orders     None        Note:  This document was prepared using Dragon voice recognition software and may include unintentional dictation errors.   Hinda Kehr, MD 04/24/22 [phone]

## 2022-04-24 NOTE — Discharge Instructions (Addendum)
Your workup in the Emergency Department today was reassuring.  We did not find any specific abnormalities.  We recommend you drink plenty of fluids, take your regular medications and/or any new ones prescribed today, and follow up with the doctor(s) listed in these documents as recommended.  Return to the Emergency Department if you develop new or worsening symptoms that concern you.  

## 2022-04-24 NOTE — ED Notes (Signed)
This RN called lab to add on urine cx

## 2022-04-24 NOTE — ED Notes (Addendum)
Pt to nurses station. Advised they were told they had been discharged and didn't want to wait on papers or repeat vitals. RN advised ok and DC'd patient.

## 2022-04-25 LAB — URINE CULTURE

## 2022-07-01 ENCOUNTER — Ambulatory Visit: Payer: Medicare HMO | Admitting: Urology

## 2023-12-23 ENCOUNTER — Other Ambulatory Visit: Payer: Self-pay | Admitting: *Deleted

## 2023-12-23 DIAGNOSIS — N200 Calculus of kidney: Secondary | ICD-10-CM

## 2023-12-29 ENCOUNTER — Ambulatory Visit: Payer: Self-pay | Admitting: Urology
